# Patient Record
Sex: Male | Born: 1950 | Race: Black or African American | Hispanic: No | Marital: Single
Health system: Southern US, Community
[De-identification: ages and names within clinical notes are randomized; demographics above are authoritative.]

## PROBLEM LIST (undated history)

## (undated) DIAGNOSIS — I10 Essential (primary) hypertension: Secondary | ICD-10-CM

## (undated) DIAGNOSIS — Z993 Dependence on wheelchair: Secondary | ICD-10-CM

## (undated) DIAGNOSIS — E7800 Pure hypercholesterolemia, unspecified: Secondary | ICD-10-CM

## (undated) DIAGNOSIS — L409 Psoriasis, unspecified: Secondary | ICD-10-CM

## (undated) DIAGNOSIS — J302 Other seasonal allergic rhinitis: Secondary | ICD-10-CM

## (undated) DIAGNOSIS — G5793 Unspecified mononeuropathy of bilateral lower limbs: Secondary | ICD-10-CM

## (undated) DIAGNOSIS — E785 Hyperlipidemia, unspecified: Secondary | ICD-10-CM

## (undated) DIAGNOSIS — E119 Type 2 diabetes mellitus without complications: Secondary | ICD-10-CM

## (undated) DIAGNOSIS — Z9181 History of falling: Secondary | ICD-10-CM

## (undated) DIAGNOSIS — I639 Cerebral infarction, unspecified: Secondary | ICD-10-CM

## (undated) DIAGNOSIS — I2510 Atherosclerotic heart disease of native coronary artery without angina pectoris: Secondary | ICD-10-CM

## (undated) DIAGNOSIS — J309 Allergic rhinitis, unspecified: Secondary | ICD-10-CM

## (undated) HISTORY — DX: Hyperlipidemia, unspecified: E78.5

## (undated) HISTORY — DX: Essential (primary) hypertension: I10

## (undated) HISTORY — DX: Other seasonal allergic rhinitis: J30.2

## (undated) HISTORY — DX: Atherosclerotic heart disease of native coronary artery without angina pectoris: I25.10

## (undated) HISTORY — DX: Unspecified mononeuropathy of bilateral lower limbs: G57.93

## (undated) HISTORY — DX: History of falling: Z91.81

## (undated) HISTORY — DX: Type 2 diabetes mellitus without complications: E11.9

## (undated) HISTORY — PX: CORONARY STENT PLACEMENT: SHX1402

## (undated) HISTORY — DX: Psoriasis, unspecified: L40.9

## (undated) HISTORY — DX: Allergic rhinitis, unspecified: J30.9

---

## 2003-11-15 ENCOUNTER — Other Ambulatory Visit: Payer: Self-pay

## 2005-11-18 ENCOUNTER — Emergency Department: Payer: Self-pay | Admitting: Unknown Physician Specialty

## 2007-10-22 ENCOUNTER — Emergency Department: Payer: Self-pay | Admitting: Internal Medicine

## 2008-03-30 ENCOUNTER — Ambulatory Visit: Payer: Self-pay | Admitting: Family Medicine

## 2008-04-07 ENCOUNTER — Ambulatory Visit: Payer: Self-pay | Admitting: Family Medicine

## 2008-05-08 ENCOUNTER — Ambulatory Visit: Payer: Self-pay | Admitting: Family Medicine

## 2008-05-09 ENCOUNTER — Emergency Department: Payer: Self-pay | Admitting: Emergency Medicine

## 2008-06-07 ENCOUNTER — Ambulatory Visit: Payer: Self-pay | Admitting: Family Medicine

## 2008-07-05 ENCOUNTER — Ambulatory Visit: Payer: Self-pay | Admitting: Family Medicine

## 2008-07-08 ENCOUNTER — Ambulatory Visit: Payer: Self-pay

## 2008-08-03 ENCOUNTER — Ambulatory Visit: Payer: Self-pay | Admitting: Family Medicine

## 2009-01-24 ENCOUNTER — Ambulatory Visit: Payer: Self-pay | Admitting: Family Medicine

## 2010-07-14 ENCOUNTER — Ambulatory Visit: Payer: Self-pay | Admitting: Gastroenterology

## 2010-07-16 LAB — PATHOLOGY REPORT

## 2010-08-25 ENCOUNTER — Ambulatory Visit: Payer: Self-pay | Admitting: Family Medicine

## 2010-11-19 ENCOUNTER — Emergency Department: Payer: Self-pay | Admitting: Emergency Medicine

## 2011-01-01 ENCOUNTER — Ambulatory Visit: Payer: Self-pay | Admitting: Family Medicine

## 2011-05-07 ENCOUNTER — Ambulatory Visit: Payer: Self-pay | Admitting: Cardiology

## 2011-11-10 ENCOUNTER — Ambulatory Visit: Payer: Self-pay | Admitting: Family Medicine

## 2012-01-09 ENCOUNTER — Inpatient Hospital Stay: Payer: Self-pay | Admitting: Internal Medicine

## 2012-01-09 LAB — PROTIME-INR
INR: 1.2
Prothrombin Time: 15.4 secs — ABNORMAL HIGH (ref 11.5–14.7)

## 2012-01-09 LAB — COMPREHENSIVE METABOLIC PANEL
Albumin: 3.1 g/dL — ABNORMAL LOW (ref 3.4–5.0)
Alkaline Phosphatase: 77 U/L (ref 50–136)
Anion Gap: 8 (ref 7–16)
Creatinine: 1.56 mg/dL — ABNORMAL HIGH (ref 0.60–1.30)
EGFR (African American): 54 — ABNORMAL LOW
Glucose: 181 mg/dL — ABNORMAL HIGH (ref 65–99)
Osmolality: 278 (ref 275–301)
Sodium: 136 mmol/L (ref 136–145)
Total Protein: 7.6 g/dL (ref 6.4–8.2)

## 2012-01-09 LAB — CK TOTAL AND CKMB (NOT AT ARMC)
CK, Total: 636 U/L — ABNORMAL HIGH (ref 35–232)
CK-MB: 2.3 ng/mL (ref 0.5–3.6)

## 2012-01-09 LAB — CBC
MCV: 94 fL (ref 80–100)
RBC: 4.35 10*6/uL — ABNORMAL LOW (ref 4.40–5.90)
RDW: 13.5 % (ref 11.5–14.5)

## 2012-01-09 LAB — PRO B NATRIURETIC PEPTIDE: B-Type Natriuretic Peptide: 199 pg/mL — ABNORMAL HIGH (ref 0–125)

## 2012-01-10 LAB — BASIC METABOLIC PANEL
Anion Gap: 11 (ref 7–16)
Chloride: 103 mmol/L (ref 98–107)
Co2: 23 mmol/L (ref 21–32)
Creatinine: 1.77 mg/dL — ABNORMAL HIGH (ref 0.60–1.30)
EGFR (African American): 47 — ABNORMAL LOW
EGFR (Non-African Amer.): 40 — ABNORMAL LOW
Osmolality: 286 (ref 275–301)
Potassium: 4.2 mmol/L (ref 3.5–5.1)
Sodium: 137 mmol/L (ref 136–145)

## 2012-01-10 LAB — CBC WITH DIFFERENTIAL/PLATELET
Basophil #: 0 10*3/uL (ref 0.0–0.1)
Eosinophil #: 0 10*3/uL (ref 0.0–0.7)
Eosinophil %: 0 %
HGB: 13.8 g/dL (ref 13.0–18.0)
Lymphocyte %: 2.3 %
MCH: 30.8 pg (ref 26.0–34.0)
MCHC: 32.9 g/dL (ref 32.0–36.0)
MCV: 94 fL (ref 80–100)
Monocyte #: 0.4 x10 3/mm (ref 0.2–1.0)
Monocyte %: 1.9 %
Platelet: 241 10*3/uL (ref 150–440)
RBC: 4.48 10*6/uL (ref 4.40–5.90)
RDW: 13.8 % (ref 11.5–14.5)
WBC: 18.7 10*3/uL — ABNORMAL HIGH (ref 3.8–10.6)

## 2012-01-10 LAB — CK TOTAL AND CKMB (NOT AT ARMC)
CK, Total: 562 U/L — ABNORMAL HIGH (ref 35–232)
CK, Total: 443 U/L — ABNORMAL HIGH (ref 35–232)
CK-MB: 2.7 ng/mL (ref 0.5–3.6)
CK-MB: 2.8 ng/mL (ref 0.5–3.6)

## 2012-01-11 LAB — CBC WITH DIFFERENTIAL/PLATELET
Basophil #: 0.1 10*3/uL (ref 0.0–0.1)
Eosinophil #: 0 10*3/uL (ref 0.0–0.7)
Eosinophil %: 0.1 %
HCT: 39.2 % — ABNORMAL LOW (ref 40.0–52.0)
HGB: 13.1 g/dL (ref 13.0–18.0)
MCH: 31.1 pg (ref 26.0–34.0)
MCHC: 33.3 g/dL (ref 32.0–36.0)
Monocyte #: 1 x10 3/mm (ref 0.2–1.0)
Monocyte %: 4.7 %
Neutrophil #: 19.6 10*3/uL — ABNORMAL HIGH (ref 1.4–6.5)
Neutrophil %: 90.3 %
RDW: 14 % (ref 11.5–14.5)
WBC: 21.7 10*3/uL — ABNORMAL HIGH (ref 3.8–10.6)

## 2012-01-11 LAB — BASIC METABOLIC PANEL
Anion Gap: 8 (ref 7–16)
Calcium, Total: 8.4 mg/dL — ABNORMAL LOW (ref 8.5–10.1)
Chloride: 112 mmol/L — ABNORMAL HIGH (ref 98–107)
Co2: 22 mmol/L (ref 21–32)
Creatinine: 1.23 mg/dL (ref 0.60–1.30)

## 2012-05-30 ENCOUNTER — Ambulatory Visit: Payer: Self-pay | Admitting: Family Medicine

## 2012-07-04 ENCOUNTER — Ambulatory Visit: Payer: Self-pay | Admitting: Family Medicine

## 2012-07-11 ENCOUNTER — Emergency Department: Payer: Self-pay | Admitting: Emergency Medicine

## 2012-07-11 LAB — BASIC METABOLIC PANEL
Anion Gap: 8 (ref 7–16)
BUN: 14 mg/dL (ref 7–18)
Calcium, Total: 9.3 mg/dL (ref 8.5–10.1)
Chloride: 107 mmol/L (ref 98–107)
Glucose: 191 mg/dL — ABNORMAL HIGH (ref 65–99)
Osmolality: 285 (ref 275–301)

## 2012-07-11 LAB — CBC WITH DIFFERENTIAL/PLATELET
Basophil #: 0 10*3/uL (ref 0.0–0.1)
Eosinophil #: 0.1 10*3/uL (ref 0.0–0.7)
Lymphocyte #: 1.4 10*3/uL (ref 1.0–3.6)
Lymphocyte %: 18.2 %
MCV: 93 fL (ref 80–100)
Monocyte %: 7.2 %
Neutrophil #: 5.4 10*3/uL (ref 1.4–6.5)
Platelet: 295 10*3/uL (ref 150–440)
RDW: 13.6 % (ref 11.5–14.5)
WBC: 7.8 10*3/uL (ref 3.8–10.6)

## 2012-10-02 ENCOUNTER — Emergency Department: Payer: Self-pay | Admitting: Emergency Medicine

## 2013-03-10 IMAGING — CR RIGHT ANKLE - COMPLETE 3+ VIEW
1 series · 5 of 5 positions shown · non-contrast
Comparison: none

REASON FOR EXAM: pain
COMMENTS:

PROCEDURE:     NIEVE - NIEVE ANKLE RIGHT COMPLETE  - July 04, 2012  [DATE]
RESULT:     There is no evidence of fracture, dislocation, or malalignment.
Soft tissues are identified within the medial and lateral malleolar regions
possibly representing ligamentous injury.

[Series 1: ap · 0.17mm/px · 5 of 5 slices shown]
[im 1/5]
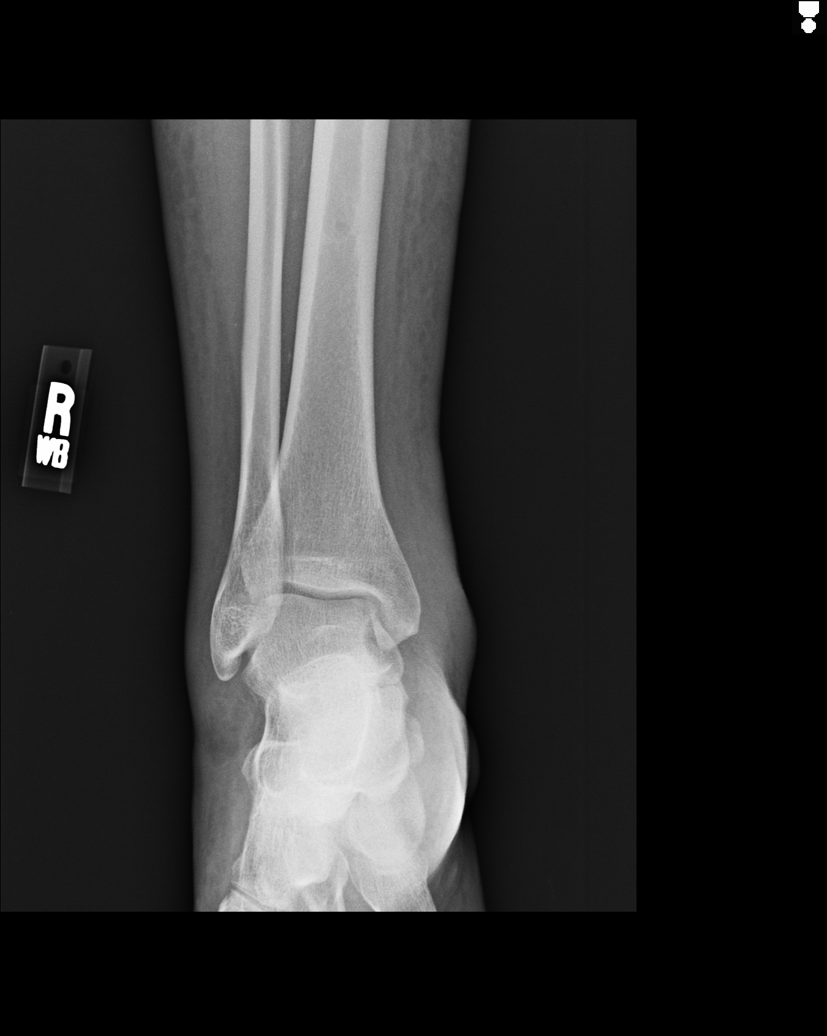
[im 2/5]
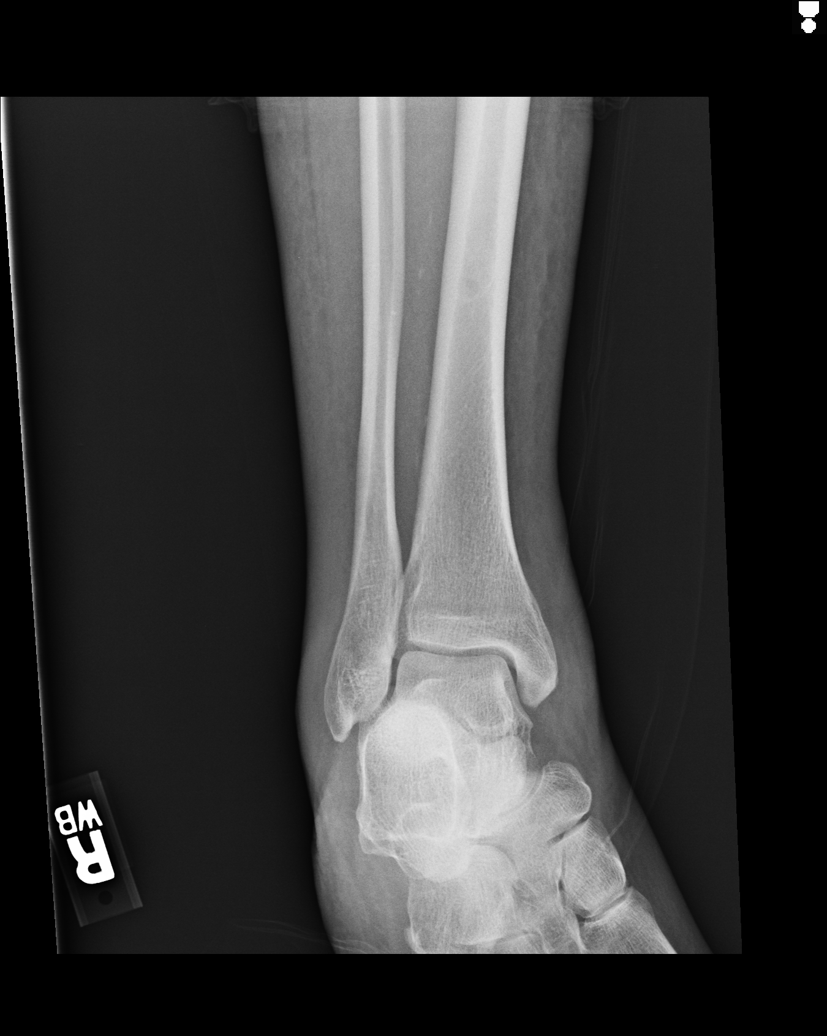
[im 3/5]
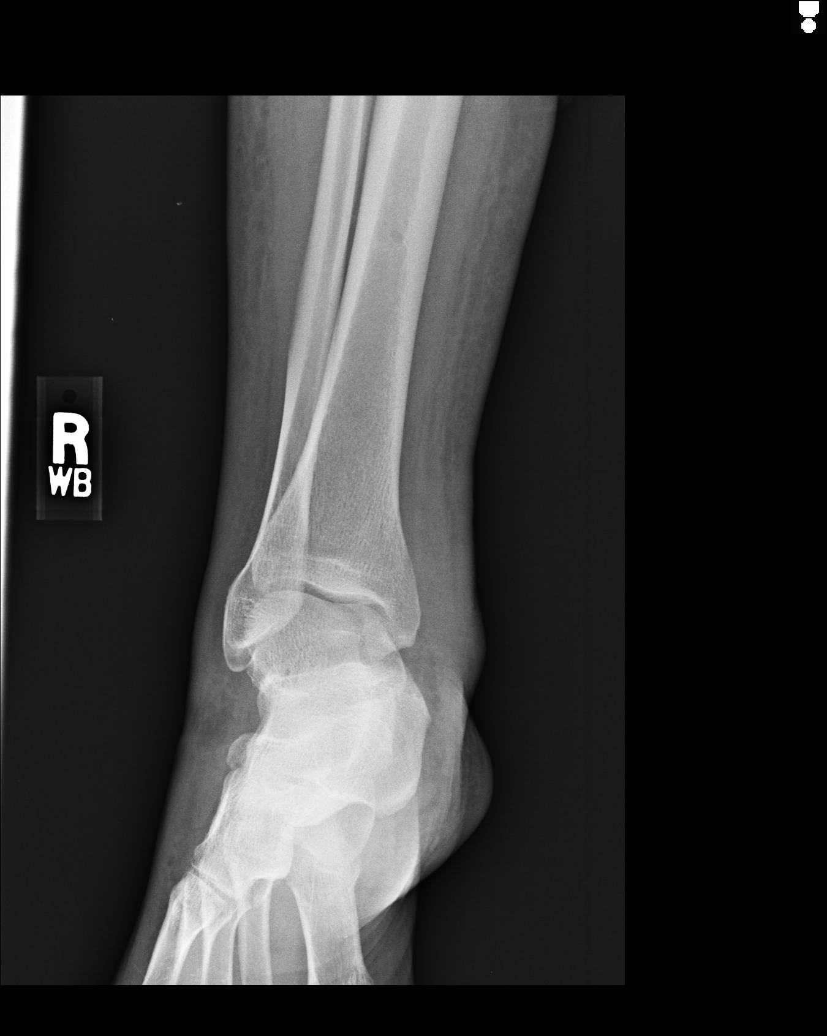
[im 4/5]
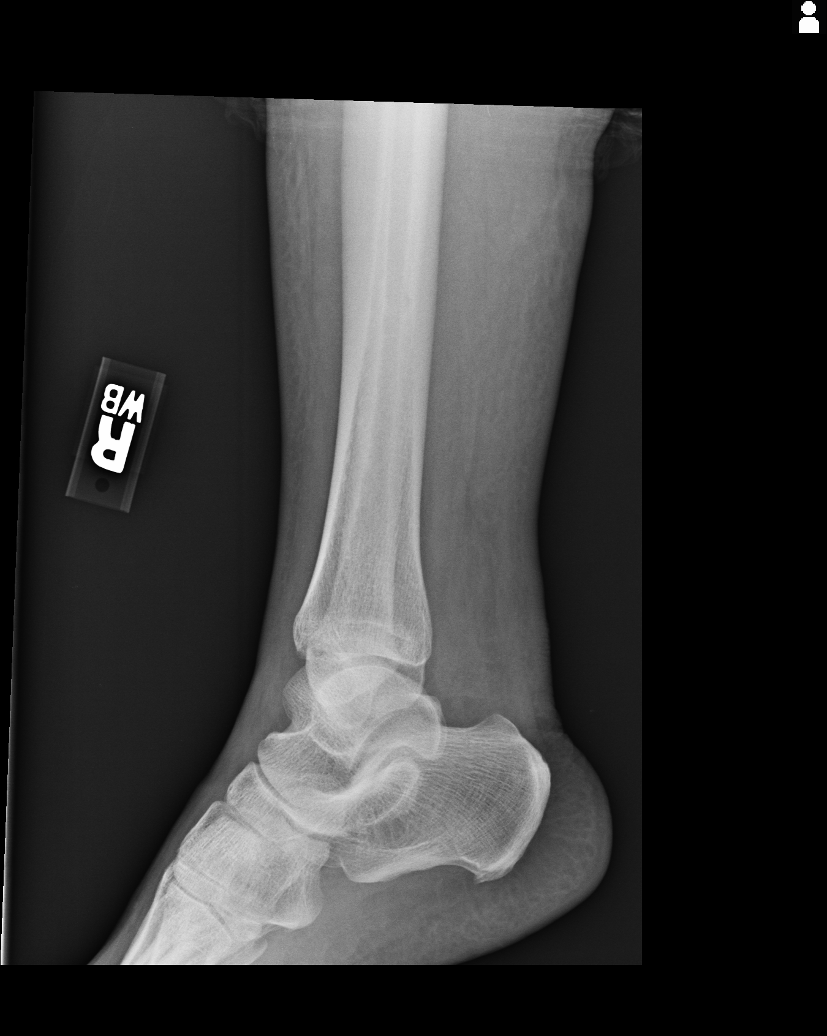
[im 5/5]
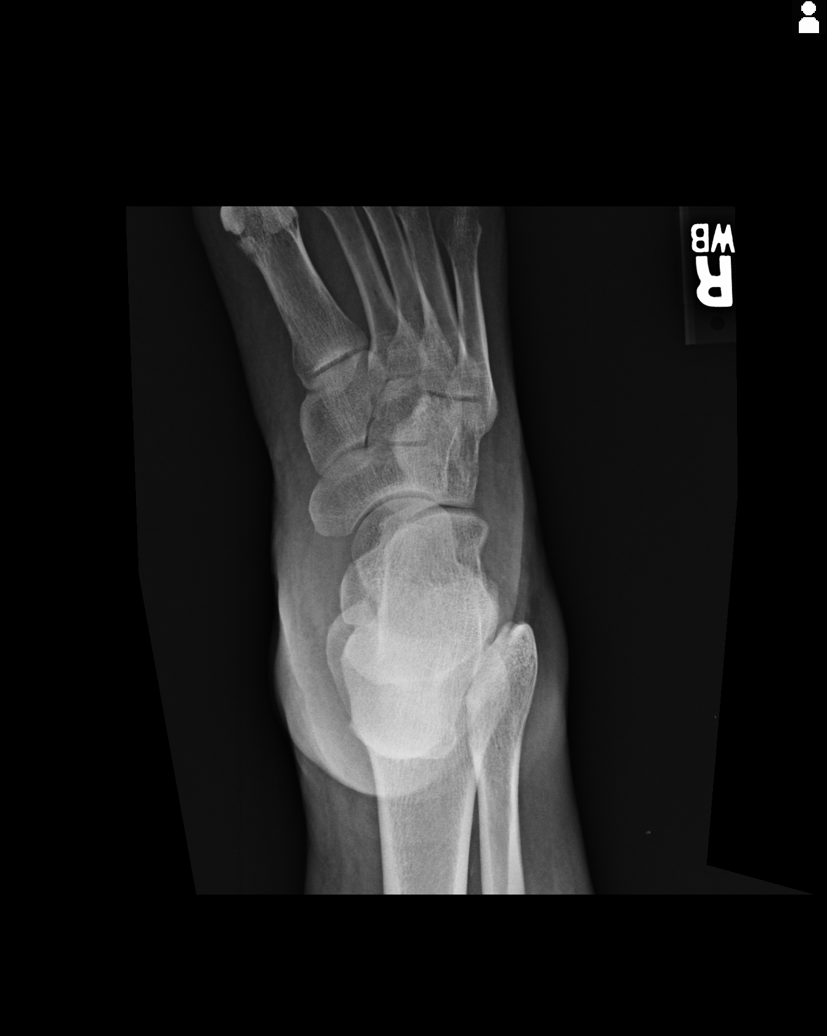

[5 of 5 positions shown; findings below may reference images not displayed]

IMPRESSION: 1. No evidence of acute abnormalities.
2. If there are persistent complaints of pain or persistent clinical
concern, a repeat evaluation in 7-10 days is recommended if clinically
warranted.

## 2013-03-20 ENCOUNTER — Ambulatory Visit: Payer: Self-pay | Admitting: Family Medicine

## 2013-07-10 ENCOUNTER — Emergency Department: Payer: Self-pay | Admitting: Emergency Medicine

## 2013-07-10 LAB — CBC
HCT: 40.9 % (ref 40.0–52.0)
HGB: 13.6 g/dL (ref 13.0–18.0)
MCH: 30.1 pg (ref 26.0–34.0)
MCHC: 33.2 g/dL (ref 32.0–36.0)
MCV: 91 fL (ref 80–100)
Platelet: 282 10*3/uL (ref 150–440)
RBC: 4.51 10*6/uL (ref 4.40–5.90)
RDW: 14.1 % (ref 11.5–14.5)
WBC: 8.8 10*3/uL (ref 3.8–10.6)

## 2013-07-10 LAB — COMPREHENSIVE METABOLIC PANEL
Albumin: 2.9 g/dL — ABNORMAL LOW (ref 3.4–5.0)
Alkaline Phosphatase: 76 U/L (ref 50–136)
Anion Gap: 0 — ABNORMAL LOW (ref 7–16)
BUN: 11 mg/dL (ref 7–18)
Calcium, Total: 8.9 mg/dL (ref 8.5–10.1)
Chloride: 105 mmol/L (ref 98–107)
Creatinine: 0.96 mg/dL (ref 0.60–1.30)
EGFR (African American): >60
EGFR (Non-African Amer.): >60
Osmolality: 267 (ref 275–301)
Potassium: 5.5 mmol/L — ABNORMAL HIGH (ref 3.5–5.1)
SGOT(AST): 38 U/L — ABNORMAL HIGH (ref 15–37)
SGPT (ALT): 27 U/L (ref 12–78)
Sodium: 131 mmol/L — ABNORMAL LOW (ref 136–145)
Total Protein: 7.3 g/dL (ref 6.4–8.2)

## 2013-07-10 LAB — URINALYSIS, COMPLETE
Bilirubin,UR: NEGATIVE
Blood: NEGATIVE
Glucose,UR: 150 mg/dL (ref 0–75)
Ketone: NEGATIVE
Nitrite: NEGATIVE
Protein: NEGATIVE
RBC,UR: NONE SEEN /HPF (ref 0–5)
Squamous Epithelial: 5
WBC UR: 2 /HPF (ref 0–5)

## 2013-07-12 DIAGNOSIS — L409 Psoriasis, unspecified: Secondary | ICD-10-CM | POA: Insufficient documentation

## 2013-07-12 DIAGNOSIS — E1140 Type 2 diabetes mellitus with diabetic neuropathy, unspecified: Secondary | ICD-10-CM | POA: Insufficient documentation

## 2013-07-12 DIAGNOSIS — R079 Chest pain, unspecified: Secondary | ICD-10-CM | POA: Insufficient documentation

## 2013-07-12 DIAGNOSIS — I10 Essential (primary) hypertension: Secondary | ICD-10-CM | POA: Insufficient documentation

## 2013-07-12 DIAGNOSIS — I2510 Atherosclerotic heart disease of native coronary artery without angina pectoris: Secondary | ICD-10-CM | POA: Insufficient documentation

## 2013-10-17 ENCOUNTER — Ambulatory Visit: Payer: Self-pay | Admitting: Family Medicine

## 2014-03-14 DIAGNOSIS — Z9889 Other specified postprocedural states: Secondary | ICD-10-CM | POA: Insufficient documentation

## 2014-03-14 DIAGNOSIS — E785 Hyperlipidemia, unspecified: Secondary | ICD-10-CM | POA: Insufficient documentation

## 2014-08-28 ENCOUNTER — Emergency Department: Payer: Self-pay | Admitting: Emergency Medicine

## 2014-11-13 DIAGNOSIS — T8189XA Other complications of procedures, not elsewhere classified, initial encounter: Secondary | ICD-10-CM | POA: Diagnosis not present

## 2014-11-13 DIAGNOSIS — G5791 Unspecified mononeuropathy of right lower limb: Secondary | ICD-10-CM | POA: Diagnosis not present

## 2014-11-13 DIAGNOSIS — E119 Type 2 diabetes mellitus without complications: Secondary | ICD-10-CM | POA: Diagnosis not present

## 2014-11-13 DIAGNOSIS — I2510 Atherosclerotic heart disease of native coronary artery without angina pectoris: Secondary | ICD-10-CM | POA: Diagnosis not present

## 2014-11-13 DIAGNOSIS — I10 Essential (primary) hypertension: Secondary | ICD-10-CM | POA: Diagnosis not present

## 2014-11-13 DIAGNOSIS — G5792 Unspecified mononeuropathy of left lower limb: Secondary | ICD-10-CM | POA: Diagnosis not present

## 2014-11-13 DIAGNOSIS — E785 Hyperlipidemia, unspecified: Secondary | ICD-10-CM | POA: Diagnosis not present

## 2014-11-13 DIAGNOSIS — J302 Other seasonal allergic rhinitis: Secondary | ICD-10-CM | POA: Diagnosis not present

## 2014-12-03 DIAGNOSIS — I10 Essential (primary) hypertension: Secondary | ICD-10-CM | POA: Diagnosis not present

## 2014-12-03 DIAGNOSIS — E785 Hyperlipidemia, unspecified: Secondary | ICD-10-CM | POA: Diagnosis not present

## 2014-12-18 DIAGNOSIS — R0789 Other chest pain: Secondary | ICD-10-CM | POA: Diagnosis not present

## 2014-12-18 DIAGNOSIS — I25119 Atherosclerotic heart disease of native coronary artery with unspecified angina pectoris: Secondary | ICD-10-CM | POA: Diagnosis not present

## 2014-12-18 DIAGNOSIS — E119 Type 2 diabetes mellitus without complications: Secondary | ICD-10-CM | POA: Diagnosis not present

## 2014-12-18 DIAGNOSIS — I10 Essential (primary) hypertension: Secondary | ICD-10-CM | POA: Diagnosis not present

## 2014-12-30 NOTE — Discharge Summary (Signed)
PATIENT NAME:  Juan King, [name] MR#:  [id number] DATE OF BIRTH:  27-Jan-1951  DATE OF ADMISSION:  01/09/2012 DATE OF DISCHARGE:  01/11/2012  PRIMARY CARE PHYSICIAN: Dr. Nilda SimmerKristi Smith   FINAL DIAGNOSES:  1. Clinical sepsis.  2. Pneumonia.  3. Acute renal failure, which improved.  4. Hypertension.  5. Diabetes.  6. Hyperlipidemia.  7. History of coronary artery disease.   MEDICATIONS ON DISCHARGE:  1. Metformin extended-release 500 mg daily.  2. Dyazide 25/37.5 mg 1 tablet daily.  3. Simvastatin 80 mg daily.  4. Aspirin 81 mg daily.  5. Plavix 75 mg daily.  6. Amlodipine/benazepril 10/40, 1 tablet daily.  7. Levaquin 500 mg p.o. daily until completion.  8. Albuterol inhaler 2 puffs q.6 hours as needed for shortness of breath.   DIET: Low sodium diet 1800 ADA diet.   ACTIVITY: Activity as tolerated.   FOLLOW UP: Follow up in one week with Dr. Nilda SimmerKristi Smith.   REASON FOR ADMISSION: Patient was admitted 01/09/2012, discharged 01/11/2012. Patient came in with short winded, vomiting after lunch.   HISTORY OF PRESENT ILLNESS: 64 year old man with history of coronary artery disease, diabetes, hypertension, hyperlipidemia presents with shortness of breath, cough, brownish sputum, also vomited one time. In the Emergency Room he had a temperature of 100.2, heart rate was up 120 to 145, sinus tachycardia. In the Emergency Room patient received IV Solu-Medrol, Levaquin. Hospitalist services were contacted for admission. White count was elevated and chest x-ray consistent with a pneumonia. Patient was admitted with sepsis, started on IV Levaquin for the pneumonia, given IV fluid hydration. For the acute renal failure his Glucophage was held. Dyazide was held.   LABORATORY, DIAGNOSTIC AND RADIOLOGICAL DATA: BNP 199, glucose 181, BUN 18, creatinine 1.56, sodium 136, potassium 3.9, chloride 106, CO2 22, calcium 8.9, bilirubin 1.2. Liver function tests normal range. Albumin 3.1. White blood cell count  20.3, hemoglobin and hematocrit 13.5 and 40.8, platelet count 258. PT 15.4, INR 1.2, PTT 28.7. Chest x-ray showed findings consistent with atelectasis or developing infiltrate in the left lower lung. Blood cultures negative for 36 hours. EKG showed sinus tachycardia 122 beats per minute. CT scan of the chest showed pleural based density adjacent to the major fissure, anterior aspect of the left lower lobe likely reflects an ill-defined pneumonia but a neoplasm cannot be dogmatically excluded. Follow-up chest x-rays and CT scan recommended to ensure clearing. Cardiac enzymes remain negative. Creatinine did increase to 1.77 and then down to 1.23 upon discharge. White count upon discharge was still elevated at 21.7.   HOSPITAL COURSE PER PROBLEM LIST:  1. For the patient's clinical sepsis, his vital signs had improved, heart rate under 100, patient afebrile. He did still have persistent leukocytosis. He was feeling better with regards to his pneumonia and wanted to be discharged home. I discharged him home in stable condition with close clinical follow up on Levaquin 500 mg daily.  2. For the patient's pneumonia, the patient did improve during the hospital course. Vital signs had improved. Patient afebrile. The patient does still have an elevated white count. I recommend checking a CBC in one week with follow-up appointment.  3. For the patient's acute renal failure, he improved with IV fluid hydration, creatinine down to 1.23 upon discharge, GFR greater than 60, so I can restart his Dyazide and metformin as outpatient. Recommend checking a BMP next week.  4. For the patient's hypertension, blood pressure currently stable, 135/84.  5. For the patient's diabetes, he was held on  sliding scale during the hospital stay and his Glucophage was held. Glucophage can be restarted as outpatient.  6. For his hyperlipidemia, he is on simvastatin.  7. For his history of coronary artery disease, he is on aspirin and Plavix.   8. Recommend checking a repeat chest x-ray or CT scan of the chest in about six weeks to ensure clearing of the pneumonia.    TIME SPENT ON DISCHARGE: 35 minutes.   ____________________________ Herschell Dimes. Renae Gloss, MD rjw:cms D: 01/11/2012 16:56:07 ET T: 01/12/2012 11:19:25 ET JOB#: [id number]  cc: Herschell Dimes. Renae Gloss, MD, <Dictator> Myrle Sheng. Katrinka Blazing, MD Salley Scarlet MD ELECTRONICALLY SIGNED 01/18/2012 12:49

## 2014-12-30 NOTE — H&P (Signed)
PATIENT NAME:  Juan King, Juan King MR#:  [id number] DATE OF BIRTH:  25-Jun-1951  DATE OF ADMISSION:  01/09/2012  PRIMARY CARE PHYSICIAN: Dr. Nilda Simmer  CARDIOLOGIST: Dr. Marcina Millard   CHIEF COMPLAINT: I was short winded and also vomited after I ate lunch.  HISTORY OF PRESENT ILLNESS: The patient is a very poor historian. He is a 64 year old male  who has history of coronary artery disease-he had a drug-eluting stent placed in the LAD in August of 2012, diabetes, hypertension, hyperlipidemia. He denies any history of asthma or chronic obstructive pulmonary disease. Today he presented to the Emergency Room because he said he was short winded. He is complaining of cough with brownish sputum. He denies any chest pain. He said he also threw up today after he ate food. He denies any abdominal pain, denies any diarrhea. He said he was running a fever at home also. His temperature in the Emergency Room, as per the ER physician, was at one time 100.2. He was found to be extremely tachycardic in the Emergency Room. His heart rate shot up to 120 to 145, sinus tachycardia in the Emergency Room. The patient got DuoNebs in the Emergency Room along with IV Solu-Medrol, Levaquin; and Hospitalist Service was asked to admit the patient because of pneumonia along with tachycardia. He denies any pleuritic chest pain. His initial work-up in the Emergency Room showed that he had a white count of 20,000. His creatinine is elevated at 1.56. BNP is almost negative at 199. His chest x-ray was suggestive of a developing infiltrate in the left lower lobe, and a CT of the chest is pending at this time.   REVIEW OF SYSTEMS: He is a limited historian. CONSTITUTIONAL: Positive for fever. HEENT: He denies any change in his vision. Denies any headache or dizziness. RESPIRATORY:  He is complaining of cough, shortness of breath, brownish expectoration. CARDIOVASCULAR: No chest pain. No palpitations. No syncope. GASTROINTESTINAL: He  is also complaining of some nausea and vomiting. No abdominal pain. No diarrhea. GENITOURINARY: No dysuria. No frequency. ENDOCRINE: No thyroid problems. HEMATOLOGIC: No anemia. INTEGUMENT: No rash. MUSCULOSKELETAL: No joint pains or swelling. NEUROLOGIC: No focal numbness or weakness. PSYCHIATRIC: Denies any anxiety.    PAST MEDICAL HISTORY:  1. Coronary artery disease. He had a cardiac catheterization done in August 2012, and he had a drug-eluting stent placed in the left anterior descending artery in August 2012.  2. Diabetes. 3. Hypertension. 4. Hyperlipidemia.   PAST SURGICAL HISTORY: None.   ALLERGIES TO MEDICATIONS: None.   HOME MEDICATIONS: As per Dr. Darrold Junker' list at [facility]:  1. Aspirin 81 mg daily.  2. Plavix 75 mg daily. 3. Triamterene/hydrochlorothiazide 1 capsule daily. 4. Metformin XR 5 mg daily.  5. Lipitor 40 mg daily.  6. Amlodipine/benazepril 10/40 mg daily.   SOCIAL HISTORY: He is married. He lives with his wife. He has three kids. He denies any smoking, alcohol, or drug use.   FAMILY HISTORY: He says his mother and sister had stroke. As per [facility] note, the mother had myocardial infarction in her 56s.   PHYSICAL EXAMINATION:  VITAL SIGNS: Temperature 96.2 when she came in, heart rate of 145, respiratory rate 22, blood pressure 138/76, saturating 94% on room air.   GENERAL: This is an elderly African American male. He is comfortably sitting on room air, in no acute distress, but he is diaphoretic.   HEENT: Bilateral pupils are equal. Extraocular muscles are intact. No scleral icterus. No conjunctivitis. Oral mucosa is  moist. No pallor.   NECK: No thyroid tenderness, enlargement or nodule. Neck is supple. No masses, nontender. No adenopathy. No JVD. No carotid bruit.   CHEST: He has some crackles at the bases, more on the left side. Normal respiratory effort. Not using accessory muscles of respiration.   HEART: Heart sounds are regular,  tachycardic. No murmur. Good peripheral pulses. No lower extremity edema.   ABDOMEN: Abdomen appears to be soft and nontender. Good bowel sounds. No hepatomegaly. No bruit. No masses.   RECTAL: Exam is deferred.   NEUROLOGIC: He is awake, alert. He is oriented to time, place, and person. He has poor insight. Cranial nerves are intact. Moving all extremities against gravity.   SKIN: He has psoriatic patches on his flexural surfaces of the left upper extremity.   LABORATORY, DIAGNOSTIC AND RADIOLOGICAL DATA:  White count of 20.3, hemoglobin 13.5, platelet count 258,000.  BMP shows sodium 136, potassium 3.9, BUN 18, creatinine 1.56. His last creatinine in August of 2012 was normal at 1.1.  His CK is elevated at 636. Troponin is negative.  His BNP, as I said before, is 199, almost negative.  His chest x-ray is suggestive of atelectasis or developing infiltrate in the left lower lobe.  His EKG shows that he has sinus tachycardia at 122. No acute ischemic changes.   IMPRESSION:  1. Evidence of systemic inflammatory response syndrome secondary to pneumonia as evidenced by leukocytosis and tachycardia. 2. Left lower lobe pneumonia.  3. Leukocytosis most likely secondary to pneumonia.  4. Acute renal failure, may be secondary to dehydration. 5. Sinus tachycardia, may be secondary to dehydration and pneumonia. 6. Coronary artery disease with previous drug-eluting stent in August 2012.  7. Diabetes. 8. Hypertension.  9. Hyperlipidemia.  10. Psoriasis.   PLAN: A 64 year old male who has history of coronary artery disease, recent drug-eluting stent, diabetes, hypertension, hyperlipidemia, presents with vomiting. He presented with shortness of breath with brownish expectoration, found to have leukocytosis with white count of 20,000. His creatinine is elevated at 1.5. He is tachycardic, and he has a developing infiltrate in the left lower lobe. Blood cultures have been done in the Emergency Room as  well as sputum culture. He got Levaquin in the Emergency Room. We will continue Levaquin on him. He is not wheezing, and he does not have any history of asthma or chronic obstructive pulmonary disease.  I will not continue Solu-Medrol on him at this time. I will give him IV hydration. I am going to hold his Maxzide at this time because of his acute renal failure. I am also going to cut down his benazepril at this time because of renal failure. I will hold his metformin because of his renal failure. With IV hydration, his heart rate should improve. We will continue aspirin, Plavix, statin, and we will also add some beta blocker. Considering that he had a recent stent and coronary artery disease his heart rate needs to be better controlled. He has a CT of the chest pending at this time. This needs to be followed up, but it appears to be low probability for PE. We will monitor him on telemetry.    TIME SPENT:  Time spent with admission and coordination of care was 50 minutes.   ____________________________ Fredia SorrowAbhinav Madalyn Legner, MD ag:cbb D: 01/09/2012 21:08:23 ET T: 01/10/2012 08:48:58 ET JOB#: [id number]  cc: Fredia SorrowAbhinav Falesha Schommer, MD, <Dictator> Myrle ShengKristi M. Katrinka BlazingSmith, MD Fredia SorrowABHINAV Errin Whitelaw MD ELECTRONICALLY SIGNED 02/03/2012 12:37

## 2015-02-22 ENCOUNTER — Telehealth: Payer: Self-pay | Admitting: Family Medicine

## 2015-02-22 NOTE — Telephone Encounter (Signed)
Spoke to pt's daughter who states her father needs refills on the following medications:   Amlodipine 2.5mg   Pharmacy: [facility]  Lisinotril 40 mg Pharmacy [facility]

## 2015-02-26 NOTE — Telephone Encounter (Signed)
Yes can refill for 1 month each with 3 refills.  He should have appt in August.  Need to send to one pharmacy or the other.  Difficult to send to different pharmacies.-jh

## 2015-02-26 NOTE — Telephone Encounter (Signed)
Can we do this or need appt?

## 2015-02-28 MED ORDER — LISINOPRIL 40 MG PO TABS: 40.0000 mg | ORAL_TABLET | Freq: Every day | ORAL | Status: DC

## 2015-02-28 MED ORDER — AMLODIPINE BESYLATE 2.5 MG PO TABS: 2.5000 mg | ORAL_TABLET | Freq: Every day | ORAL | Status: DC

## 2015-02-28 NOTE — Telephone Encounter (Signed)
Sent both prescriptions/JH

## 2015-04-17 ENCOUNTER — Ambulatory Visit (INDEPENDENT_AMBULATORY_CARE_PROVIDER_SITE_OTHER): Payer: Commercial Managed Care - HMO | Admitting: Family Medicine

## 2015-04-17 ENCOUNTER — Encounter: Payer: Self-pay | Admitting: Family Medicine

## 2015-04-17 VITALS — BP 145/80 | HR 80 | Temp 98.0°F | Resp 16 | Ht 60.0 in | Wt 138.0 lb

## 2015-04-17 DIAGNOSIS — I2510 Atherosclerotic heart disease of native coronary artery without angina pectoris: Secondary | ICD-10-CM | POA: Diagnosis not present

## 2015-04-17 DIAGNOSIS — J4520 Mild intermittent asthma, uncomplicated: Secondary | ICD-10-CM | POA: Diagnosis not present

## 2015-04-17 DIAGNOSIS — E785 Hyperlipidemia, unspecified: Secondary | ICD-10-CM

## 2015-04-17 DIAGNOSIS — I10 Essential (primary) hypertension: Secondary | ICD-10-CM | POA: Diagnosis not present

## 2015-04-17 DIAGNOSIS — W3400XA Accidental discharge from unspecified firearms or gun, initial encounter: Secondary | ICD-10-CM | POA: Insufficient documentation

## 2015-04-17 DIAGNOSIS — J302 Other seasonal allergic rhinitis: Secondary | ICD-10-CM | POA: Insufficient documentation

## 2015-04-17 DIAGNOSIS — E119 Type 2 diabetes mellitus without complications: Secondary | ICD-10-CM

## 2015-04-17 DIAGNOSIS — J45909 Unspecified asthma, uncomplicated: Secondary | ICD-10-CM | POA: Insufficient documentation

## 2015-04-17 LAB — POCT GLYCOSYLATED HEMOGLOBIN (HGB A1C): HEMOGLOBIN A1C: 6.4

## 2015-04-17 MED ORDER — LISINOPRIL 40 MG PO TABS: 40.0000 mg | ORAL_TABLET | Freq: Every day | ORAL | Status: DC

## 2015-04-17 MED ORDER — AMLODIPINE BESYLATE 5 MG PO TABS: 5.0000 mg | ORAL_TABLET | Freq: Every day | ORAL | Status: DC

## 2015-04-17 MED ORDER — AMITRIPTYLINE HCL 10 MG PO TABS: 10.0000 mg | ORAL_TABLET | Freq: Every day | ORAL | Status: DC

## 2015-04-17 MED ORDER — METFORMIN HCL 500 MG PO TABS: 500.0000 mg | ORAL_TABLET | Freq: Every day | ORAL | Status: DC

## 2015-04-17 MED ORDER — METOPROLOL SUCCINATE ER 200 MG PO TB24: 200.0000 mg | ORAL_TABLET | Freq: Every day | ORAL | Status: DC

## 2015-04-17 MED ORDER — ATORVASTATIN CALCIUM 80 MG PO TABS: 80.0000 mg | ORAL_TABLET | Freq: Every day | ORAL | Status: DC

## 2015-04-17 MED ORDER — FLUTICASONE PROPIONATE 50 MCG/ACT NA SUSP: 1.0000 | Freq: Every day | NASAL | Status: DC

## 2015-04-17 MED ORDER — ALBUTEROL SULFATE HFA 108 (90 BASE) MCG/ACT IN AERS: 2.0000 | INHALATION_SPRAY | Freq: Four times a day (QID) | RESPIRATORY_TRACT | Status: DC | PRN

## 2015-04-17 MED ORDER — ISOSORBIDE MONONITRATE ER 30 MG PO TB24: 30.0000 mg | ORAL_TABLET | Freq: Every day | ORAL | Status: DC

## 2015-04-17 NOTE — Patient Instructions (Signed)
ontinue all current meds except increase dose of Amlodipine.

## 2015-04-17 NOTE — Progress Notes (Signed)
Name: Juan King   MRN: [id number]    DOB: 11-15-1950   Date:04/17/2015       Progress Note  Subjective  Chief Complaint  Chief Complaint  Patient presents with  . Follow-up    F/U HTN/DM/LIPIDS  . Diabetes    with peripherql neuropathy  . Hyperlipidemia  . Hypertension  . Asthma  . Coronary Artery Disease    HPI  Here to f/u DM, HBP, elevaetd lipids, ASCVD, Asthma, seasonal allergies.  Taking all meds.  His peripheral neuropathy is doing well overall.  BSs at home 115-125.   No problem-specific assessment & plan notes found for this encounter.   Past Medical History  Diagnosis Date  . Diabetes mellitus type 2, controlled   . Screening for depression   . Neuropathy of both feet   . Personal history of fall   . Coronary arteriosclerosis   . Psoriasis   . Hypertension   . Primary hypertension   . Seasonal allergies   . Hyperlipidemia   . Allergic rhinitis     Social History  Substance Use Topics  . Smoking status: Never Smoker   . Smokeless tobacco: Not on file  . Alcohol Use: No     Current outpatient prescriptions:  .  albuterol (PROVENTIL HFA;VENTOLIN HFA) 108 (90 BASE) MCG/ACT inhaler, Inhale 2 puffs into the lungs every 6 (six) hours as needed for wheezing or shortness of breath., Disp: , Rfl:  .  amitriptyline (ELAVIL) 10 MG tablet, Take 10 mg by mouth at bedtime., Disp: , Rfl:  .  amLODipine (NORVASC) 2.5 MG tablet, Take 1 tablet (2.5 mg total) by mouth daily., Disp: 90 tablet, Rfl: 3 .  aspirin EC 81 MG tablet, Take 81 mg by mouth daily., Disp: , Rfl:  .  atorvastatin (LIPITOR) 40 MG tablet, Take 40 mg by mouth daily., Disp: , Rfl: 12 .  cholecalciferol (VITAMIN D) 1000 UNITS tablet, Take 1,000 Units by mouth daily., Disp: , Rfl:  .  fluticasone (FLONASE) 50 MCG/ACT nasal spray, Place 1 spray into both nostrils daily., Disp: , Rfl:  .  guaiFENesin (MUCINEX) 600 MG 12 hr tablet, Take 600 mg by mouth 2 (two) times daily., Disp: , Rfl:  .  isosorbide  mononitrate (IMDUR) 30 MG 24 hr tablet, Take 30 mg by mouth daily., Disp: , Rfl: 11 .  lisinopril (PRINIVIL,ZESTRIL) 40 MG tablet, Take 1 tablet (40 mg total) by mouth daily., Disp: 90 tablet, Rfl: 3 .  metFORMIN (GLUCOPHAGE) 500 MG tablet, Take 500 mg by mouth daily. , Disp: , Rfl:  .  metoprolol (TOPROL-XL) 200 MG 24 hr tablet, Take 200 mg by mouth daily., Disp: , Rfl:   No Known Allergies  Review of Systems  Constitutional: Negative for fever, chills, weight loss and malaise/fatigue.  HENT: Negative for hearing loss.   Eyes: Negative for blurred vision and double vision.  Respiratory: Negative for cough, sputum production, shortness of breath and wheezing.   Cardiovascular: Negative for chest pain, palpitations, orthopnea and leg swelling.  Gastrointestinal: Negative for heartburn, nausea, vomiting, abdominal pain, diarrhea and blood in stool.  Genitourinary: Negative for dysuria, urgency and frequency.  Skin: Negative for rash.  Neurological: Negative for dizziness, tingling, sensory change, focal weakness, weakness and headaches.  Psychiatric/Behavioral: Negative for depression. The patient is not nervous/anxious.       Objective  Filed Vitals:   04/17/15 0948  BP: 171/87  Pulse: 80  Temp: 98 F (36.7 C)  TempSrc: Oral  Resp: 16  Height: 5' (1.524 m)  Weight: 138 lb (62.596 kg)     Physical Exam  Constitutional: He is oriented to person, place, and time and well-developed, well-nourished, and in no distress. No distress.  HENT:  Head: Normocephalic and atraumatic.  Eyes: Conjunctivae and EOM are normal. Pupils are equal, round, and reactive to light. No scleral icterus.  Neck: Normal range of motion. Neck supple. Normal carotid pulses present. Carotid bruit is not present. No thyromegaly present.  Cardiovascular: Normal rate, regular rhythm, normal heart sounds and intact distal pulses.  Exam reveals no gallop and no friction rub.   No murmur heard. Pulmonary/Chest:  Effort normal and breath sounds normal. No respiratory distress. He has no wheezes. He has no rales.  Abdominal: Soft. Bowel sounds are normal. He exhibits no distension and no mass. There is no tenderness.  Musculoskeletal: Normal range of motion. He exhibits no edema.  Lymphadenopathy:    He has no cervical adenopathy.  Neurological: He is alert and oriented to person, place, and time. No cranial nerve deficit.  Vitals reviewed.     Recent Results (from the past 2160 hour(s))  POCT HgB A1C     Status: Abnormal   Collection Time: 04/17/15 10:10 AM  Result Value Ref Range   Hemoglobin A1C 6.4      Assessment & Plan  1. Type 2 diabetes mellitus without complication  - POCT HgB A1C - amitriptyline (ELAVIL) 10 MG tablet; Take 1 tablet (10 mg total) by mouth at bedtime.  Dispense: 90 tablet; Refill: 3 - metFORMIN (GLUCOPHAGE) 500 MG tablet; Take 1 tablet (500 mg total) by mouth daily with breakfast.  Dispense: 90 tablet; Refill: 3  2. Asthma, mild intermittent, uncomplicated  - albuterol (PROVENTIL HFA;VENTOLIN HFA) 108 (90 BASE) MCG/ACT inhaler; Inhale 2 puffs into the lungs every 6 (six) hours as needed for wheezing or shortness of breath.  Dispense: 3 Inhaler; Refill: 12  3. Seasonal allergies  - fluticasone (FLONASE) 50 MCG/ACT nasal spray; Place 1 spray into both nostrils daily.  Dispense: 16 g; Refill: 12  4. Essential hypertension  - amLODipine (NORVASC) 5 MG tablet; Take 1 tablet (5 mg total) by mouth daily.  Dispense: 90 tablet; Refill: 3 - lisinopril (PRINIVIL,ZESTRIL) 40 MG tablet; Take 1 tablet (40 mg total) by mouth daily.  Dispense: 90 tablet; Refill: 3 - metoprolol (TOPROL-XL) 200 MG 24 hr tablet; Take 1 tablet (200 mg total) by mouth daily.  Dispense: 90 tablet; Refill: 3  5. ASCVD (arteriosclerotic cardiovascular disease)  - isosorbide mononitrate (IMDUR) 30 MG 24 hr tablet; Take 1 tablet (30 mg total) by mouth daily.  Dispense: 90 tablet; Refill: 3  6.  Elevated lipids  - atorvastatin (LIPITOR) 80 MG tablet; Take 1 tablet (80 mg total) by mouth daily.  Dispense: 90 tablet; Refill: 3

## 2015-08-06 ENCOUNTER — Encounter: Payer: Self-pay | Admitting: Family Medicine

## 2015-08-06 ENCOUNTER — Other Ambulatory Visit: Payer: Self-pay | Admitting: *Deleted

## 2015-08-06 ENCOUNTER — Ambulatory Visit (INDEPENDENT_AMBULATORY_CARE_PROVIDER_SITE_OTHER): Payer: Commercial Managed Care - HMO | Admitting: Family Medicine

## 2015-08-06 VITALS — BP 110/65 | HR 62 | Resp 16 | Ht 60.0 in | Wt 142.0 lb

## 2015-08-06 DIAGNOSIS — E119 Type 2 diabetes mellitus without complications: Secondary | ICD-10-CM

## 2015-08-06 DIAGNOSIS — Z23 Encounter for immunization: Secondary | ICD-10-CM

## 2015-08-06 DIAGNOSIS — I10 Essential (primary) hypertension: Secondary | ICD-10-CM

## 2015-08-06 LAB — POCT GLYCOSYLATED HEMOGLOBIN (HGB A1C): Hemoglobin A1C: 6

## 2015-08-06 MED ORDER — AMLODIPINE BESYLATE 2.5 MG PO TABS: 2.5000 mg | ORAL_TABLET | Freq: Every day | ORAL | Status: DC

## 2015-08-06 MED ORDER — AMLODIPINE BESYLATE 2.5 MG PO TABS: 5.0000 mg | ORAL_TABLET | Freq: Every day | ORAL | Status: DC

## 2015-08-06 MED ORDER — METFORMIN HCL 500 MG PO TABS: ORAL_TABLET | ORAL | Status: DC

## 2015-08-06 NOTE — Progress Notes (Signed)
Name: Juan King   MRN: [id number]    DOB: 1950-09-25   Date:08/06/2015       Progress Note  Subjective  Chief Complaint  Chief Complaint  Patient presents with  . Diabetes    HPI Here for f/u of DM and HBP.  Losing weight.  Feeling well.  No problem-specific assessment & plan notes found for this encounter.   Past Medical History  Diagnosis Date  . Diabetes mellitus type 2, controlled (HCC)   . Screening for depression   . Neuropathy of both feet (HCC)   . Personal history of fall   . Coronary arteriosclerosis   . Psoriasis   . Hypertension   . Primary hypertension   . Seasonal allergies   . Hyperlipidemia   . Allergic rhinitis     Past Surgical History  Procedure Laterality Date  . Coronary stent placement      History reviewed. No pertinent family history.  Social History   Social History  . Marital Status: Married    Spouse Name: N/A  . Number of Children: N/A  . Years of Education: N/A   Occupational History  . Not on file.   Social History Main Topics  . Smoking status: Never Smoker   . Smokeless tobacco: Not on file  . Alcohol Use: No  . Drug Use: No  . Sexual Activity: Not on file   Other Topics Concern  . Not on file   Social History Narrative     Current outpatient prescriptions:  .  albuterol (PROVENTIL HFA;VENTOLIN HFA) 108 (90 BASE) MCG/ACT inhaler, Inhale 2 puffs into the lungs every 6 (six) hours as needed for wheezing or shortness of breath., Disp: 3 Inhaler, Rfl: 12 .  amitriptyline (ELAVIL) 10 MG tablet, Take 1 tablet (10 mg total) by mouth at bedtime., Disp: 90 tablet, Rfl: 3 .  amLODipine (NORVASC) 2.5 MG tablet, Take 2 tablets (5 mg total) by mouth daily., Disp: 90 tablet, Rfl: 3 .  aspirin EC 81 MG tablet, Take 81 mg by mouth daily., Disp: , Rfl:  .  atorvastatin (LIPITOR) 80 MG tablet, Take 1 tablet (80 mg total) by mouth daily., Disp: 90 tablet, Rfl: 3 .  cholecalciferol (VITAMIN D) 1000 UNITS tablet, Take 1,000 Units  by mouth daily., Disp: , Rfl:  .  fluticasone (FLONASE) 50 MCG/ACT nasal spray, Place 1 spray into both nostrils daily., Disp: 16 g, Rfl: 12 .  guaiFENesin (MUCINEX) 600 MG 12 hr tablet, Take 600 mg by mouth 2 (two) times daily., Disp: , Rfl:  .  isosorbide mononitrate (IMDUR) 30 MG 24 hr tablet, Take 1 tablet (30 mg total) by mouth daily., Disp: 90 tablet, Rfl: 3 .  lisinopril (PRINIVIL,ZESTRIL) 40 MG tablet, Take 1 tablet (40 mg total) by mouth daily., Disp: 90 tablet, Rfl: 3 .  metFORMIN (GLUCOPHAGE) 500 MG tablet, Take 1/2 tablet by mouth each morning., Disp: 45 tablet, Rfl: 3 .  metoprolol (TOPROL-XL) 200 MG 24 hr tablet, Take 1 tablet (200 mg total) by mouth daily., Disp: 90 tablet, Rfl: 3  Not on File   Review of Systems  Constitutional: Positive for weight loss (desired). Negative for fever, chills and malaise/fatigue.  HENT: Negative for hearing loss.   Eyes: Negative for blurred vision and double vision.  Respiratory: Negative for cough, shortness of breath and wheezing.   Cardiovascular: Negative for chest pain, palpitations and leg swelling.  Gastrointestinal: Negative for heartburn, abdominal pain and blood in stool.  Genitourinary: Negative for dysuria,  urgency and frequency.  Musculoskeletal: Negative for myalgias and joint pain.  Skin: Negative for rash.  Neurological: Negative for dizziness, tremors, weakness and headaches.      Objective  Filed Vitals:   08/06/15 1009 08/06/15 1024  BP: 111/61 110/65  Pulse: 62   Resp: 16   Height: 5' (1.524 m)   Weight: 142 lb (64.411 kg)     Physical Exam  Constitutional: He is oriented to person, place, and time and well-developed, well-nourished, and in no distress. No distress.  HENT:  Head: Normocephalic and atraumatic.  Eyes: Conjunctivae and EOM are normal. Pupils are equal, round, and reactive to light. No scleral icterus.  Neck: Normal range of motion. Neck supple. Carotid bruit is not present. No thyromegaly  present.  Cardiovascular: Normal rate, regular rhythm and normal heart sounds.  Exam reveals no gallop and no friction rub.   No murmur heard. Pulmonary/Chest: Effort normal and breath sounds normal. No respiratory distress. He has no wheezes. He has no rales.  Abdominal: Soft. Bowel sounds are normal. He exhibits no distension, no abdominal bruit and no mass. There is no tenderness.  Musculoskeletal: He exhibits no edema.  Lymphadenopathy:    He has no cervical adenopathy.  Neurological: He is alert and oriented to person, place, and time.  Vitals reviewed.      Recent Results (from the past 2160 hour(s))  POCT HgB A1C     Status: Normal   Collection Time: 08/06/15 10:15 AM  Result Value Ref Range   Hemoglobin A1C 6.0      Assessment & Plan  Problem List Items Addressed This Visit      Cardiovascular and Mediastinum   BP (high blood pressure)   Relevant Medications   amLODipine (NORVASC) 2.5 MG tablet    Other Visit Diagnoses    Type 2 diabetes mellitus without complication, without long-term current use of insulin (HCC)    -  Primary    Relevant Medications    metFORMIN (GLUCOPHAGE) 500 MG tablet    Other Relevant Orders    POCT HgB A1C (Completed)    Need for influenza vaccination        Relevant Orders    Flu vaccine HIGH DOSE PF (Fluzone High dose)       Meds ordered this encounter  Medications  . metFORMIN (GLUCOPHAGE) 500 MG tablet    Sig: Take 1/2 tablet by mouth each morning.    Dispense:  45 tablet    Refill:  3  . amLODipine (NORVASC) 2.5 MG tablet    Sig: Take 2 tablets (5 mg total) by mouth daily.    Dispense:  90 tablet    Refill:  3   1. Type 2 diabetes mellitus without complication, without long-term current use of insulin (HCC)  - POCT HgB A1C - metFORMIN (GLUCOPHAGE) 500 MG tablet; Take 1/2 tablet by mouth each morning.  Dispense: 45 tablet; Refill: 3  2. Need for influenza vaccination  - Flu vaccine HIGH DOSE PF (Fluzone High  dose)  3. Essential hypertension - amLODipine (NORVASC) 2.5 MG tablet; Take 2 tablets (5 mg total) by mouth daily.  Dispense: 90 tablet; Refill: 3

## 2015-08-06 NOTE — Patient Instructions (Signed)
Continue his current meds except as indicated.

## 2015-11-19 DIAGNOSIS — H40003 Preglaucoma, unspecified, bilateral: Secondary | ICD-10-CM | POA: Diagnosis not present

## 2015-12-05 ENCOUNTER — Telehealth: Payer: Self-pay | Admitting: Family Medicine

## 2015-12-05 ENCOUNTER — Encounter: Payer: Self-pay | Admitting: Family Medicine

## 2015-12-05 ENCOUNTER — Ambulatory Visit (INDEPENDENT_AMBULATORY_CARE_PROVIDER_SITE_OTHER): Payer: Commercial Managed Care - HMO | Admitting: Family Medicine

## 2015-12-05 VITALS — BP 190/90 | HR 73 | Temp 98.4°F | Resp 16 | Wt 137.6 lb

## 2015-12-05 DIAGNOSIS — I10 Essential (primary) hypertension: Secondary | ICD-10-CM | POA: Diagnosis not present

## 2015-12-05 DIAGNOSIS — E119 Type 2 diabetes mellitus without complications: Secondary | ICD-10-CM

## 2015-12-05 LAB — POCT GLYCOSYLATED HEMOGLOBIN (HGB A1C)

## 2015-12-05 MED ORDER — AMLODIPINE BESYLATE 5 MG PO TABS: 2.5000 mg | ORAL_TABLET | Freq: Every day | ORAL | Status: DC

## 2015-12-05 MED ORDER — CHLORTHALIDONE 25 MG PO TABS: 25.0000 mg | ORAL_TABLET | Freq: Every day | ORAL | Status: DC

## 2015-12-05 NOTE — Progress Notes (Signed)
Name: Juan King   MRN: [id number]    DOB: 19-Dec-1950   Date:12/05/2015       Progress Note  Subjective  Chief Complaint  Chief Complaint  Patient presents with  . Follow-up    Diabetes, tingling in both feet doesn't seem to be made better with medication  . Diabetes  . Hypertension    HPI Here for f/u of DM and HBP.  Checks BSs but doesn't remember any values.  He c/o some feet neuropathy bothering him.  No problem-specific assessment & plan notes found for this encounter.   Past Medical History  Diagnosis Date  . Diabetes mellitus type 2, controlled (HCC)   . Screening for depression   . Neuropathy of both feet (HCC)   . Personal history of fall   . Coronary arteriosclerosis   . Psoriasis   . Hypertension   . Primary hypertension   . Seasonal allergies   . Hyperlipidemia   . Allergic rhinitis     Past Surgical History  Procedure Laterality Date  . Coronary stent placement      History reviewed. No pertinent family history.  Social History   Social History  . Marital Status: Married    Spouse Name: N/A  . Number of Children: N/A  . Years of Education: N/A   Occupational History  . Not on file.   Social History Main Topics  . Smoking status: Never Smoker   . Smokeless tobacco: Not on file  . Alcohol Use: No  . Drug Use: No  . Sexual Activity: Not on file   Other Topics Concern  . Not on file   Social History Narrative     Current outpatient prescriptions:  .  albuterol (PROVENTIL HFA;VENTOLIN HFA) 108 (90 BASE) MCG/ACT inhaler, Inhale 2 puffs into the lungs every 6 (six) hours as needed for wheezing or shortness of breath., Disp: 3 Inhaler, Rfl: 12 .  amitriptyline (ELAVIL) 10 MG tablet, Take 1 tablet (10 mg total) by mouth at bedtime., Disp: 90 tablet, Rfl: 3 .  amLODipine (NORVASC) 5 MG tablet, Take 0.5 tablets (2.5 mg total) by mouth daily., Disp: 30 tablet, Rfl: 6 .  aspirin EC 81 MG tablet, Take 81 mg by mouth daily., Disp: , Rfl:  .   atorvastatin (LIPITOR) 80 MG tablet, Take 1 tablet (80 mg total) by mouth daily., Disp: 90 tablet, Rfl: 3 .  cholecalciferol (VITAMIN D) 1000 UNITS tablet, Take 1,000 Units by mouth daily. Reported on 12/05/2015, Disp: , Rfl:  .  fluticasone (FLONASE) 50 MCG/ACT nasal spray, Place 1 spray into both nostrils daily., Disp: 16 g, Rfl: 12 .  guaiFENesin (MUCINEX) 600 MG 12 hr tablet, Take 600 mg by mouth 2 (two) times daily., Disp: , Rfl:  .  isosorbide mononitrate (IMDUR) 30 MG 24 hr tablet, Take 1 tablet (30 mg total) by mouth daily., Disp: 90 tablet, Rfl: 3 .  lisinopril (PRINIVIL,ZESTRIL) 40 MG tablet, Take 1 tablet (40 mg total) by mouth daily., Disp: 90 tablet, Rfl: 3 .  metFORMIN (GLUCOPHAGE) 500 MG tablet, Take 1/2 tablet by mouth each morning., Disp: 45 tablet, Rfl: 3 .  metoprolol (TOPROL-XL) 200 MG 24 hr tablet, Take 1 tablet (200 mg total) by mouth daily., Disp: 90 tablet, Rfl: 3 .  chlorthalidone (HYGROTON) 25 MG tablet, Take 1 tablet (25 mg total) by mouth daily., Disp: 30 tablet, Rfl: 6  No Known Allergies   Review of Systems  Constitutional: Negative for fever, chills, weight loss and malaise/fatigue.  HENT: Negative for hearing loss.   Eyes: Negative for blurred vision and double vision.  Respiratory: Negative for cough, shortness of breath and wheezing.   Cardiovascular: Negative for chest pain, palpitations and leg swelling.  Gastrointestinal: Negative for heartburn, abdominal pain and blood in stool.  Genitourinary: Negative for dysuria, urgency and frequency.  Skin: Negative for rash.  Neurological: Negative for dizziness, tingling, tremors, weakness and headaches.      Objective  Filed Vitals:   12/05/15 1111 12/05/15 1134  BP: 209/112 190/90  Pulse: 73   Temp: 98.4 F (36.9 C)   TempSrc: Oral   Resp: 16   Weight: 137 lb 9.6 oz (62.415 kg)     Physical Exam  Constitutional: He is oriented to person, place, and time and well-developed, well-nourished, and in  no distress. No distress.  HENT:  Head: Normocephalic and atraumatic.  Eyes: Conjunctivae and EOM are normal. Pupils are equal, round, and reactive to light. No scleral icterus.  Neck: Normal range of motion. Neck supple. Carotid bruit is not present. No thyromegaly present.  Cardiovascular: Normal rate, regular rhythm and normal heart sounds.  Exam reveals no gallop and no friction rub.   No murmur heard. Pulmonary/Chest: Effort normal and breath sounds normal. No respiratory distress. He has no wheezes. He has no rales.  Abdominal: Soft. Bowel sounds are normal. He exhibits no distension, no abdominal bruit and no mass. There is no tenderness.  Musculoskeletal: He exhibits no edema.  Lymphadenopathy:    He has no cervical adenopathy.  Neurological: He is alert and oriented to person, place, and time.  Vitals reviewed.      Recent Results (from the past 2160 hour(s))  POCT HgB A1C     Status: Abnormal   Collection Time: 12/05/15 11:17 AM  Result Value Ref Range   Hemoglobin A1C 6.2 %      Assessment & Plan  Problem List Items Addressed This Visit      Cardiovascular and Mediastinum   BP (high blood pressure)   Relevant Medications   amLODipine (NORVASC) 5 MG tablet   chlorthalidone (HYGROTON) 25 MG tablet    Other Visit Diagnoses    Diabetes mellitus without complication (HCC)    -  Primary    Relevant Orders    POCT HgB A1C (Completed)       Meds ordered this encounter  Medications  . amLODipine (NORVASC) 5 MG tablet    Sig: Take 0.5 tablets (2.5 mg total) by mouth daily.    Dispense:  30 tablet    Refill:  6  . chlorthalidone (HYGROTON) 25 MG tablet    Sig: Take 1 tablet (25 mg total) by mouth daily.    Dispense:  30 tablet    Refill:  6   1. Diabetes mellitus without complication (HCC) Cont. Metformin - POCT HgB A1C-6.2  2. Essential hypertension Cont. Lisinopril, Metoprolol, Imdur. - amLODipine (NORVASC) 5 MG tablet; Take 0.5 tablets (2.5 mg total) by  mouth daily.  Dispense: 30 tablet; Refill: 6 - chlorthalidone (HYGROTON) 25 MG tablet; Take 1 tablet (25 mg total) by mouth daily.  Dispense: 30 tablet; Refill: 6  RTC 2 weeks

## 2015-12-05 NOTE — Telephone Encounter (Signed)
Called pharmacy and spoke with Tiffany. Advised her of change to pt's Amlodipine dose. Tiffany changed during conversation. cfp

## 2015-12-16 ENCOUNTER — Ambulatory Visit (INDEPENDENT_AMBULATORY_CARE_PROVIDER_SITE_OTHER): Payer: Commercial Managed Care - HMO | Admitting: Family Medicine

## 2015-12-16 ENCOUNTER — Encounter: Payer: Self-pay | Admitting: Family Medicine

## 2015-12-16 VITALS — BP 155/80 | HR 76 | Resp 16 | Ht 60.0 in | Wt 135.0 lb

## 2015-12-16 DIAGNOSIS — I10 Essential (primary) hypertension: Secondary | ICD-10-CM | POA: Diagnosis not present

## 2015-12-16 MED ORDER — AMLODIPINE BESYLATE 5 MG PO TABS: 5.0000 mg | ORAL_TABLET | Freq: Every day | ORAL | Status: DC

## 2015-12-16 NOTE — Progress Notes (Signed)
Name: Juan King   MRN: [id number]    DOB: [birth year]   Date:12/16/2015       Progress Note  Subjective  Chief Complaint  Chief Complaint  Patient presents with  . Hypertension  . Diabetes    12/11/15 bs 99 12/12/15 bs 141 47/17 bs 117    HPI Here for f/u of HBP.  His DM has been ok recently.  He is feeling pretty well.  No problem-specific assessment & plan notes found for this encounter.   Past Medical History  Diagnosis Date  . Diabetes mellitus type 2, controlled (HCC)   . Screening for depression   . Neuropathy of both feet (HCC)   . Personal history of fall   . Coronary arteriosclerosis   . Psoriasis   . Hypertension   . Primary hypertension   . Seasonal allergies   . Hyperlipidemia   . Allergic rhinitis     Past Surgical History  Procedure Laterality Date  . Coronary stent placement      History reviewed. No pertinent family history.  Social History   Social History  . Marital Status: Married    Spouse Name: N/A  . Number of Children: N/A  . Years of Education: N/A   Occupational History  . Not on file.   Social History Main Topics  . Smoking status: Never Smoker   . Smokeless tobacco: Never Used  . Alcohol Use: No  . Drug Use: No  . Sexual Activity: Not on file   Other Topics Concern  . Not on file   Social History Narrative     Current outpatient prescriptions:  .  albuterol (PROVENTIL HFA;VENTOLIN HFA) 108 (90 BASE) MCG/ACT inhaler, Inhale 2 puffs into the lungs every 6 (six) hours as needed for wheezing or shortness of breath., Disp: 3 Inhaler, Rfl: 12 .  amitriptyline (ELAVIL) 10 MG tablet, Take 1 tablet (10 mg total) by mouth at bedtime., Disp: 90 tablet, Rfl: 3 .  amLODipine (NORVASC) 5 MG tablet, Take 1 tablet (5 mg total) by mouth daily., Disp: 30 tablet, Rfl: 6 .  aspirin EC 81 MG tablet, Take 81 mg by mouth daily., Disp: , Rfl:  .  atorvastatin (LIPITOR) 80 MG tablet, Take 1 tablet (80 mg total) by mouth daily., Disp: 90  tablet, Rfl: 3 .  chlorthalidone (HYGROTON) 25 MG tablet, Take 1 tablet (25 mg total) by mouth daily., Disp: 30 tablet, Rfl: 6 .  cholecalciferol (VITAMIN D) 1000 UNITS tablet, Take 1,000 Units by mouth daily. Reported on 12/05/2015, Disp: , Rfl:  .  fluticasone (FLONASE) 50 MCG/ACT nasal spray, Place 1 spray into both nostrils daily., Disp: 16 g, Rfl: 12 .  guaiFENesin (MUCINEX) 600 MG 12 hr tablet, Take 600 mg by mouth 2 (two) times daily., Disp: , Rfl:  .  isosorbide mononitrate (IMDUR) 30 MG 24 hr tablet, Take 1 tablet (30 mg total) by mouth daily., Disp: 90 tablet, Rfl: 3 .  latanoprost (XALATAN) 0.005 % ophthalmic solution, Place 1 drop into both eyes 2 (two) times daily., Disp: , Rfl:  .  lisinopril (PRINIVIL,ZESTRIL) 40 MG tablet, Take 1 tablet (40 mg total) by mouth daily., Disp: 90 tablet, Rfl: 3 .  metFORMIN (GLUCOPHAGE) 500 MG tablet, Take 1/2 tablet by mouth each morning., Disp: 45 tablet, Rfl: 3 .  metoprolol (TOPROL-XL) 200 MG 24 hr tablet, Take 1 tablet (200 mg total) by mouth daily., Disp: 90 tablet, Rfl: 3  Not on File   Review of Systems  Constitutional: Negative for fever, chills, weight loss and malaise/fatigue.  HENT: Negative for hearing loss.   Eyes: Negative for blurred vision and double vision.  Respiratory: Negative for cough, shortness of breath and wheezing.   Cardiovascular: Negative for chest pain, palpitations and leg swelling.  Gastrointestinal: Negative for heartburn, abdominal pain and blood in stool.  Genitourinary: Negative for dysuria, urgency and frequency.  Skin: Negative for rash.  Neurological: Negative for dizziness, tremors, weakness and headaches.      Objective  Filed Vitals:   12/16/15 1007 12/16/15 1131  BP: 150/90 155/80  Pulse: 76   Resp: 16   Height: 5' (1.524 m)   Weight: 135 lb (61.236 kg)   SpO2: 98%     Physical Exam  Constitutional: He is oriented to person, place, and time and well-developed, well-nourished, and in no  distress. No distress.  HENT:  Head: Normocephalic and atraumatic.  Eyes: Conjunctivae and EOM are normal. Pupils are equal, round, and reactive to light. No scleral icterus.  Neck: Normal range of motion. Neck supple. Carotid bruit is not present. No thyromegaly present.  Cardiovascular: Normal rate, regular rhythm and normal heart sounds.  Exam reveals no gallop and no friction rub.   No murmur heard. Pulmonary/Chest: Effort normal and breath sounds normal. No respiratory distress. He has no wheezes. He has no rales.  Musculoskeletal: He exhibits no edema.  Lymphadenopathy:    He has no cervical adenopathy.  Neurological: He is alert and oriented to person, place, and time.  Vitals reviewed.      Recent Results (from the past 2160 hour(s))  POCT HgB A1C     Status: Abnormal   Collection Time: 12/05/15 11:17 AM  Result Value Ref Range   Hemoglobin A1C 6.2 %      Assessment & Plan  Problem List Items Addressed This Visit      Cardiovascular and Mediastinum   BP (high blood pressure)   Relevant Medications   amLODipine (NORVASC) 5 MG tablet   Essential (primary) hypertension - Primary   Relevant Medications   amLODipine (NORVASC) 5 MG tablet      Meds ordered this encounter  Medications  . latanoprost (XALATAN) 0.005 % ophthalmic solution    Sig: Place 1 drop into both eyes 2 (two) times daily.  Marland Kitchen amLODipine (NORVASC) 5 MG tablet    Sig: Take 1 tablet (5 mg total) by mouth daily.    Dispense:  30 tablet    Refill:  6   1. Essential (primary) hypertension   2. Essential hypertension  - amLODipine (NORVASC) 5 MG tablet; Take 1 tablet (5 mg total) by mouth daily.  Dispense: 30 tablet; Refill: 6  Cont. Metoprolol, Chlorthaladone, Lisinopril, Imdur at current doses.

## 2016-01-01 ENCOUNTER — Encounter: Payer: Self-pay | Admitting: Family Medicine

## 2016-01-01 ENCOUNTER — Ambulatory Visit (INDEPENDENT_AMBULATORY_CARE_PROVIDER_SITE_OTHER): Payer: Commercial Managed Care - HMO | Admitting: Family Medicine

## 2016-01-01 VITALS — BP 160/84 | HR 74 | Temp 98.5°F | Resp 16 | Ht 60.0 in | Wt 139.0 lb

## 2016-01-01 DIAGNOSIS — B351 Tinea unguium: Secondary | ICD-10-CM

## 2016-01-01 DIAGNOSIS — L602 Onychogryphosis: Secondary | ICD-10-CM | POA: Diagnosis not present

## 2016-01-01 DIAGNOSIS — I10 Essential (primary) hypertension: Secondary | ICD-10-CM

## 2016-01-01 DIAGNOSIS — E1140 Type 2 diabetes mellitus with diabetic neuropathy, unspecified: Secondary | ICD-10-CM

## 2016-01-01 MED ORDER — GABAPENTIN 100 MG PO CAPS: 100.0000 mg | ORAL_CAPSULE | Freq: Three times a day (TID) | ORAL | Status: DC

## 2016-01-01 MED ORDER — BLOOD PRESSURE CUFF MISC: 1.0000 | Freq: Every day | Status: DC

## 2016-01-01 MED ORDER — GABAPENTIN 100 MG PO CAPS: ORAL_CAPSULE | ORAL | Status: DC

## 2016-01-01 NOTE — Progress Notes (Signed)
Subjective:    Patient ID: Juan HakeLarry D Stegman, male    DOB: [birth year], 65 y.o.   MRN: [id number]  HPI: Juan King is a 65 y.o. male presenting on 01/01/2016 for Nail Problem   HPI  Pt presents for referral to podiatry. He has a history of diabetes and has very long overgrown toenails.  He is desiring to get the cut.  Of note pt reports he has frequent beestings and numbness in his feet.  Last A1c was 6.2%. Numbness in feet has been presents for several years. He has not been on a medication for his feet.   Pt does not check BP at home. Elevated today. No HA, no chest pain, no dizziness.   Past Medical History  Diagnosis Date  . Diabetes mellitus type 2, controlled (HCC)   . Screening for depression   . Neuropathy of both feet (HCC)   . Personal history of fall   . Coronary arteriosclerosis   . Psoriasis   . Hypertension   . Primary hypertension   . Seasonal allergies   . Hyperlipidemia   . Allergic rhinitis     Current Outpatient Prescriptions on File Prior to Visit  Medication Sig  . albuterol (PROVENTIL HFA;VENTOLIN HFA) 108 (90 BASE) MCG/ACT inhaler Inhale 2 puffs into the lungs every 6 (six) hours as needed for wheezing or shortness of breath.  Marland Kitchen. amitriptyline (ELAVIL) 10 MG tablet Take 1 tablet (10 mg total) by mouth at bedtime.  Marland Kitchen. amLODipine (NORVASC) 5 MG tablet Take 1 tablet (5 mg total) by mouth daily.  Marland Kitchen. aspirin EC 81 MG tablet Take 81 mg by mouth daily.  Marland Kitchen. atorvastatin (LIPITOR) 80 MG tablet Take 1 tablet (80 mg total) by mouth daily.  . chlorthalidone (HYGROTON) 25 MG tablet Take 1 tablet (25 mg total) by mouth daily.  . cholecalciferol (VITAMIN D) 1000 UNITS tablet Take 1,000 Units by mouth daily. Reported on 12/05/2015  . fluticasone (FLONASE) 50 MCG/ACT nasal spray Place 1 spray into both nostrils daily.  Marland Kitchen. guaiFENesin (MUCINEX) 600 MG 12 hr tablet Take 600 mg by mouth 2 (two) times daily.  . isosorbide mononitrate (IMDUR) 30 MG 24 hr tablet Take 1 tablet  (30 mg total) by mouth daily.  Marland Kitchen. latanoprost (XALATAN) 0.005 % ophthalmic solution Place 1 drop into both eyes 2 (two) times daily.  Marland Kitchen. lisinopril (PRINIVIL,ZESTRIL) 40 MG tablet Take 1 tablet (40 mg total) by mouth daily.  . metFORMIN (GLUCOPHAGE) 500 MG tablet Take 1/2 tablet by mouth each morning.  . metoprolol (TOPROL-XL) 200 MG 24 hr tablet Take 1 tablet (200 mg total) by mouth daily.   No current facility-administered medications on file prior to visit.    Review of Systems  Constitutional: Negative for fever and chills.  HENT: Negative.   Respiratory: Negative for chest tightness, shortness of breath and wheezing.   Cardiovascular: Negative for chest pain, palpitations and leg swelling.  Gastrointestinal: Negative for nausea, vomiting and abdominal pain.  Endocrine: Negative.   Genitourinary: Negative for dysuria, urgency, discharge, penile pain and testicular pain.  Musculoskeletal: Negative for back pain, joint swelling and arthralgias.  Skin: Negative.        Overgrown toe nails  Neurological: Positive for numbness (feet). Negative for dizziness, weakness and headaches.  Psychiatric/Behavioral: Negative for sleep disturbance and dysphoric mood.   Per HPI unless specifically indicated above     Objective:    BP 160/84 mmHg  Pulse 74  Temp(Src) 98.5 F (36.9 C) (Oral)  Resp 16  Ht 5' (1.524 m)  Wt 139 lb (63.05 kg)  BMI 27.15 kg/m2  Wt Readings from Last 3 Encounters:  01/01/16 139 lb (63.05 kg)  12/16/15 135 lb (61.236 kg)  12/05/15 137 lb 9.6 oz (62.415 kg)    Physical Exam  Constitutional: He is oriented to person, place, and time. He appears well-developed and well-nourished. No distress.  HENT:  Head: Normocephalic and atraumatic.  Neck: Neck supple. No thyromegaly present.  Cardiovascular: Normal rate, regular rhythm and normal heart sounds.  Exam reveals no gallop and no friction rub.   No murmur heard. Pulmonary/Chest: Effort normal and breath sounds  normal. He has no wheezes.  Abdominal: Soft. Bowel sounds are normal. He exhibits no distension. There is no tenderness. There is no rebound.  Musculoskeletal: Normal range of motion. He exhibits no edema or tenderness.  Neurological: He is alert and oriented to person, place, and time. He has normal reflexes.  Skin: Skin is warm and dry. No rash noted. No erythema.  Yellow, long, rigid nails curling over toes to plantar surface.   Psychiatric: He has a normal mood and affect. His behavior is normal. Thought content normal.   Diabetic Foot Exam - Simple   Simple Foot Form  Diabetic Foot exam was performed with the following findings:  Yes 01/01/2016  9:32 AM  Visual Inspection  See comments:  Yes  Sensation Testing  See comments:  Yes  Pulse Check  Posterior Tibialis and Dorsalis pulse intact bilaterally:  Yes  Comments  Overgrown toenails on all toes curling over onto plantar surface. No sensation to monofilament bilateral feet.       Results for orders placed or performed in visit on 12/05/15  POCT HgB A1C  Result Value Ref Range   Hemoglobin A1C 6.2 %       Assessment & Plan:   Problem List Items Addressed This Visit      Cardiovascular and Mediastinum   BP (high blood pressure)    Bp elevated. Pt does not want to increase medications today. Plan to monitor BP at home x 1 mos. BP cuff prescription given. Plan for recheck- consider increasing amlodipine. CMET today.       Relevant Medications   Blood Pressure Monitoring (BLOOD PRESSURE CUFF) MISC   Other Relevant Orders   Comprehensive metabolic panel     Endocrine   Controlled type 2 diabetes with neuropathy (HCC) - Primary    No sensation on plantar surface to monofilament. Plan to start gabapentin to improve numbness. Refer to podiatry for through foot exam. Discussed importance of good shoes and checking feet regularly.       Relevant Medications   gabapentin (NEURONTIN) 100 MG capsule   Other Relevant Orders    Ambulatory referral to Podiatry    Other Visit Diagnoses    Onychomycosis of toenail        Refer to podiatry.     Relevant Orders    Ambulatory referral to Podiatry    Overgrown nail        Refer to podiatry.     Relevant Orders    Ambulatory referral to Podiatry       Meds ordered this encounter  Medications  . DISCONTD: gabapentin (NEURONTIN) 100 MG capsule    Sig: Take 1 capsule (100 mg total) by mouth 3 (three) times daily.    Dispense:  90 capsule    Refill:  3    Order Specific Question:  Supervising Provider  Answer:  Janeann Forehand [[id number]]  . Blood Pressure Monitoring (BLOOD PRESSURE CUFF) MISC    Sig: 1 each by Does not apply route daily.    Dispense:  1 each    Refill:  0    Order Specific Question:  Supervising Provider    Answer:  Janeann Forehand [phone]  . gabapentin (NEURONTIN) 100 MG capsule    Sig: Take 1 capsule at bedtime x1 week, increase to 1 AM and 1 bedtime x1 week, increase to 1 AM, 1 PM, and 1 bedtime.    Dispense:  90 capsule    Refill:  3    Order Specific Question:  Supervising Provider    Answer:  Janeann Forehand [[id number]]      Follow up plan: Return in about 4 weeks (around 01/29/2016) for BP check. Marland Kitchen

## 2016-01-01 NOTE — Assessment & Plan Note (Signed)
Bp elevated. Pt does not want to increase medications today. Plan to monitor BP at home x 1 mos. BP cuff prescription given. Plan for recheck- consider increasing amlodipine. CMET today.

## 2016-01-01 NOTE — Assessment & Plan Note (Signed)
No sensation on plantar surface to monofilament. Plan to start gabapentin to improve numbness. Refer to podiatry for through foot exam. Discussed importance of good shoes and checking feet regularly.

## 2016-01-01 NOTE — Patient Instructions (Signed)
We will get you over the the [facility] to get your nails taken care of. I want to try a medication called Gabapentin to help with the numbness in your feet.   Take 1 capsule at bedtime- it can make you sleepy. After 1 week you can increase if needed to 1 capsule in the morning and 1 at bedtime.  After 1 week you can increase if needed to 1 capsule in the morning, 1 lunchtime, and 1 bedtime.

## 2016-01-10 ENCOUNTER — Encounter: Payer: Self-pay | Admitting: *Deleted

## 2016-01-15 ENCOUNTER — Ambulatory Visit: Payer: Self-pay | Admitting: Podiatry

## 2016-01-29 ENCOUNTER — Ambulatory Visit (INDEPENDENT_AMBULATORY_CARE_PROVIDER_SITE_OTHER): Payer: Commercial Managed Care - HMO | Admitting: Podiatry

## 2016-01-29 ENCOUNTER — Encounter: Payer: Self-pay | Admitting: Podiatry

## 2016-01-29 DIAGNOSIS — M79676 Pain in unspecified toe(s): Secondary | ICD-10-CM | POA: Diagnosis not present

## 2016-01-29 DIAGNOSIS — E1142 Type 2 diabetes mellitus with diabetic polyneuropathy: Secondary | ICD-10-CM

## 2016-01-29 DIAGNOSIS — B351 Tinea unguium: Secondary | ICD-10-CM

## 2016-01-29 NOTE — Progress Notes (Signed)
   Subjective:    Patient ID: Juan King, male    DOB: [birth year], 65 y.o.   MRN: [id number]  HPI i have some toenails that long and need to be cut and i am a diabetic and my feet swell, get cold, and they are dry, peeling, cracking and they do hurt    Review of Systems  All other systems reviewed and are negative.      Objective:   Physical Exam he presents today vital signs stable alert and oriented 3. Psoriatic-type lesions to the plantar aspect of the bilateral foot. His toenails are thick yellow dystrophic onychomycotic and painful palpation.        Assessment & Plan:  Pain in limb secondary to onychomycosis.  Plan: Debrided toenails 1 through 5 bilateral.

## 2016-01-31 ENCOUNTER — Ambulatory Visit: Payer: Commercial Managed Care - HMO | Admitting: Family Medicine

## 2016-02-13 ENCOUNTER — Encounter: Payer: Self-pay | Admitting: Family Medicine

## 2016-02-13 ENCOUNTER — Ambulatory Visit (INDEPENDENT_AMBULATORY_CARE_PROVIDER_SITE_OTHER): Payer: Commercial Managed Care - HMO | Admitting: Family Medicine

## 2016-02-13 VITALS — BP 145/75 | HR 69 | Temp 98.2°F | Resp 16 | Ht 60.0 in | Wt 137.0 lb

## 2016-02-13 DIAGNOSIS — E785 Hyperlipidemia, unspecified: Secondary | ICD-10-CM | POA: Diagnosis not present

## 2016-02-13 DIAGNOSIS — I10 Essential (primary) hypertension: Secondary | ICD-10-CM

## 2016-02-13 DIAGNOSIS — I2510 Atherosclerotic heart disease of native coronary artery without angina pectoris: Secondary | ICD-10-CM

## 2016-02-13 DIAGNOSIS — E119 Type 2 diabetes mellitus without complications: Secondary | ICD-10-CM

## 2016-02-13 NOTE — Progress Notes (Signed)
Name: Juan King   MRN: [id number]    DOB: 1950-10-17   Date:02/13/2016       Progress Note  Subjective  Chief Complaint  Chief Complaint  Patient presents with  . Hypertension    HPI Here for f/u of HBP.  HE is also diabetic and has elevated lipids, but BP is main concern today.  HE checks BSs occ, but does not remember readings.  No problem-specific assessment & plan notes found for this encounter.   Past Medical History  Diagnosis Date  . Diabetes mellitus type 2, controlled (HCC)   . Screening for depression   . Neuropathy of both feet (HCC)   . Personal history of fall   . Coronary arteriosclerosis   . Psoriasis   . Hypertension   . Primary hypertension   . Seasonal allergies   . Hyperlipidemia   . Allergic rhinitis     Past Surgical History  Procedure Laterality Date  . Coronary stent placement      History reviewed. No pertinent family history.  Social History   Social History  . Marital Status: Married    Spouse Name: N/A  . Number of Children: N/A  . Years of Education: N/A   Occupational History  . Not on file.   Social History Main Topics  . Smoking status: Never Smoker   . Smokeless tobacco: Never Used  . Alcohol Use: No  . Drug Use: No  . Sexual Activity: Not on file   Other Topics Concern  . Not on file   Social History Narrative     Current outpatient prescriptions:  .  albuterol (PROVENTIL HFA;VENTOLIN HFA) 108 (90 BASE) MCG/ACT inhaler, Inhale 2 puffs into the lungs every 6 (six) hours as needed for wheezing or shortness of breath., Disp: 3 Inhaler, Rfl: 12 .  amitriptyline (ELAVIL) 10 MG tablet, Take 1 tablet (10 mg total) by mouth at bedtime., Disp: 90 tablet, Rfl: 3 .  amLODipine (NORVASC) 5 MG tablet, Take 1 tablet (5 mg total) by mouth daily., Disp: 30 tablet, Rfl: 6 .  aspirin EC 81 MG tablet, Take 81 mg by mouth daily., Disp: , Rfl:  .  atorvastatin (LIPITOR) 80 MG tablet, Take 1 tablet (80 mg total) by mouth daily.,  Disp: 90 tablet, Rfl: 3 .  Blood Pressure Monitoring (BLOOD PRESSURE CUFF) MISC, 1 each by Does not apply route daily., Disp: 1 each, Rfl: 0 .  cholecalciferol (VITAMIN D) 1000 UNITS tablet, Take 1,000 Units by mouth daily. Reported on 12/05/2015, Disp: , Rfl:  .  fluticasone (FLONASE) 50 MCG/ACT nasal spray, Place 1 spray into both nostrils daily., Disp: 16 g, Rfl: 12 .  gabapentin (NEURONTIN) 100 MG capsule, Take 1 capsule at bedtime x1 week, increase to 1 AM and 1 bedtime x1 week, increase to 1 AM, 1 PM, and 1 bedtime., Disp: 90 capsule, Rfl: 3 .  guaiFENesin (MUCINEX) 600 MG 12 hr tablet, Take 600 mg by mouth 2 (two) times daily as needed. , Disp: , Rfl:  .  hydrochlorothiazide (HYDRODIURIL) 12.5 MG tablet, Take 12.5 mg by mouth daily., Disp: , Rfl:  .  isosorbide mononitrate (IMDUR) 30 MG 24 hr tablet, Take 1 tablet (30 mg total) by mouth daily., Disp: 90 tablet, Rfl: 3 .  latanoprost (XALATAN) 0.005 % ophthalmic solution, Place 1 drop into both eyes 2 (two) times daily., Disp: , Rfl:  .  lisinopril (PRINIVIL,ZESTRIL) 40 MG tablet, Take 1 tablet (40 mg total) by mouth daily., Disp: 90  tablet, Rfl: 3 .  metFORMIN (GLUCOPHAGE) 500 MG tablet, Take 1/2 tablet by mouth each morning., Disp: 45 tablet, Rfl: 3 .  metoprolol (TOPROL-XL) 200 MG 24 hr tablet, Take 1 tablet (200 mg total) by mouth daily., Disp: 90 tablet, Rfl: 3 .  chlorthalidone (HYGROTON) 25 MG tablet, Take 1 tablet (25 mg total) by mouth daily. (Patient not taking: Reported on 02/13/2016), Disp: 30 tablet, Rfl: 6  Not on File   Review of Systems  Constitutional: Negative for fever, chills, weight loss and malaise/fatigue.  HENT: Negative for hearing loss.   Eyes: Negative for blurred vision and double vision.  Respiratory: Negative for cough, shortness of breath and wheezing.   Cardiovascular: Negative for chest pain, palpitations and leg swelling.  Gastrointestinal: Negative for heartburn, abdominal pain and blood in stool.   Genitourinary: Negative for dysuria, urgency and frequency.  Musculoskeletal: Negative for myalgias and joint pain.  Skin: Negative for rash.  Neurological: Negative for dizziness, tremors, weakness and headaches.      Objective  Filed Vitals:   02/13/16 1037 02/13/16 1100  BP: 165/97 145/75  Pulse: 69   Temp: 98.2 F (36.8 C)   TempSrc: Oral   Resp: 16   Height: 5' (1.524 m)   Weight: 137 lb (62.143 kg)     Physical Exam  Constitutional: He is oriented to person, place, and time and well-developed, well-nourished, and in no distress. No distress.  HENT:  Head: Normocephalic and atraumatic.  Eyes: Conjunctivae and EOM are normal. Pupils are equal, round, and reactive to light. No scleral icterus.  Neck: Normal range of motion. Neck supple. Carotid bruit is not present. No thyromegaly present.  Cardiovascular: Regular rhythm and normal heart sounds.  Exam reveals no gallop and no friction rub.   No murmur heard. Pulmonary/Chest: Effort normal and breath sounds normal. No respiratory distress. He has no wheezes. He has no rales.  Musculoskeletal: He exhibits no edema.  Lymphadenopathy:    He has no cervical adenopathy.  Neurological: He is alert and oriented to person, place, and time.  Vitals reviewed.      Recent Results (from the past 2160 hour(s))  POCT HgB A1C     Status: Abnormal   Collection Time: 12/05/15 11:17 AM  Result Value Ref Range   Hemoglobin A1C 6.2 %      Assessment & Plan  Problem List Items Addressed This Visit      Cardiovascular and Mediastinum   BP (high blood pressure) - Primary   Relevant Medications   hydrochlorothiazide (HYDRODIURIL) 12.5 MG tablet   Atherosclerosis of coronary artery   Relevant Medications   hydrochlorothiazide (HYDRODIURIL) 12.5 MG tablet     Endocrine   Controlled type 2 diabetes mellitus without complication (HCC)     Other   HLD (hyperlipidemia)   Relevant Medications   hydrochlorothiazide  (HYDRODIURIL) 12.5 MG tablet      Meds ordered this encounter  Medications  . hydrochlorothiazide (HYDRODIURIL) 12.5 MG tablet    Sig: Take 12.5 mg by mouth daily.   1. Essential hypertension Cont meds  2. Atherosclerosis of native coronary artery without angina pectoris, unspecified whether native or transplanted heart Cont meds  3. Controlled type 2 diabetes mellitus without complication, without long-term current use of insulin (HCC) cont meds  4. HLD (hyperlipidemia) Cont meds

## 2016-03-06 ENCOUNTER — Encounter: Payer: Self-pay | Admitting: Family Medicine

## 2016-03-06 ENCOUNTER — Ambulatory Visit: Payer: Commercial Managed Care - HMO | Admitting: Family Medicine

## 2016-03-06 ENCOUNTER — Ambulatory Visit (INDEPENDENT_AMBULATORY_CARE_PROVIDER_SITE_OTHER): Payer: Commercial Managed Care - HMO | Admitting: Family Medicine

## 2016-03-06 VITALS — BP 180/95 | HR 75 | Temp 98.4°F | Resp 16 | Ht 60.0 in | Wt 139.4 lb

## 2016-03-06 DIAGNOSIS — E1140 Type 2 diabetes mellitus with diabetic neuropathy, unspecified: Secondary | ICD-10-CM

## 2016-03-06 DIAGNOSIS — N181 Chronic kidney disease, stage 1: Secondary | ICD-10-CM

## 2016-03-06 DIAGNOSIS — I10 Essential (primary) hypertension: Secondary | ICD-10-CM

## 2016-03-06 DIAGNOSIS — E0822 Diabetes mellitus due to underlying condition with diabetic chronic kidney disease: Secondary | ICD-10-CM

## 2016-03-06 LAB — POCT GLYCOSYLATED HEMOGLOBIN (HGB A1C): HEMOGLOBIN A1C: 6.3

## 2016-03-06 MED ORDER — LISINOPRIL 20 MG PO TABS: 40.0000 mg | ORAL_TABLET | Freq: Every day | ORAL | Status: DC

## 2016-03-06 MED ORDER — CHLORTHALIDONE 25 MG PO TABS: 25.0000 mg | ORAL_TABLET | Freq: Every day | ORAL | Status: DC

## 2016-03-06 MED ORDER — AMLODIPINE BESYLATE 10 MG PO TABS: 10.0000 mg | ORAL_TABLET | Freq: Every day | ORAL | Status: DC

## 2016-03-06 NOTE — Progress Notes (Signed)
Name: Juan King   MRN: [id number]    DOB: 1950-10-23   Date:03/06/2016       Progress Note  Subjective  Chief Complaint  Chief Complaint  Patient presents with  . Medical Clearance    pt is having dental procedure last week but B/P was high so needed clearance from PCP pt check his B/P today was 170/100    HPI Here for evaluation for dental procedure.  His BP was 190+ systolic at dentist and procedure cancelled.  There is major [facility] and if he is taking any meds.  He states that he is taking the mds that he is supposed to, but [facility], nothing for DM and 1/2 of his BP meds and said that he has not had meds filled since March.?????  No problem-specific assessment & plan notes found for this encounter.   Past Medical History  Diagnosis Date  . Diabetes mellitus type 2, controlled (HCC)   . Screening for depression   . Neuropathy of both feet (HCC)   . Personal history of fall   . Coronary arteriosclerosis   . Psoriasis   . Hypertension   . Primary hypertension   . Seasonal allergies   . Hyperlipidemia   . Allergic rhinitis     Past Surgical History  Procedure Laterality Date  . Coronary stent placement      History reviewed. No pertinent family history.  Social History   Social History  . Marital Status: Married    Spouse Name: N/A  . Number of Children: N/A  . Years of Education: N/A   Occupational History  . Not on file.   Social History Main Topics  . Smoking status: Never Smoker   . Smokeless tobacco: Never Used  . Alcohol Use: No  . Drug Use: No  . Sexual Activity: Not on file   Other Topics Concern  . Not on file   Social History Narrative     Current outpatient prescriptions:  .  amLODipine (NORVASC) 5 MG tablet, Take 1 tablet (5 mg total) by mouth daily., Disp: 30 tablet, Rfl: 6 .  Blood Pressure Monitoring (BLOOD PRESSURE CUFF) MISC, 1 each by Does not apply route daily.,  Disp: 1 each, Rfl: 0 .  chlorthalidone (HYGROTON) 25 MG tablet, Take 1 tablet (25 mg total) by mouth daily., Disp: 30 tablet, Rfl: 6 .  gabapentin (NEURONTIN) 100 MG capsule, Take 1 capsule at bedtime x1 week, increase to 1 AM and 1 bedtime x1 week, increase to 1 AM, 1 PM, and 1 bedtime., Disp: 90 capsule, Rfl: 3 .  ibuprofen (ADVIL,MOTRIN) 600 MG tablet, Take 600 mg by mouth every 6 (six) hours as needed., Disp: , Rfl:  .  isosorbide mononitrate (IMDUR) 30 MG 24 hr tablet, Take 1 tablet (30 mg total) by mouth daily., Disp: 90 tablet, Rfl: 3 .  amitriptyline (ELAVIL) 10 MG tablet, Take 1 tablet (10 mg total) by mouth at bedtime. (Patient not taking: Reported on 03/06/2016), Disp: 90 tablet, Rfl: 3 .  aspirin EC 81 MG tablet, Take 81 mg by mouth daily. Reported on 03/06/2016, Disp: , Rfl:  .  atorvastatin (LIPITOR) 80 MG tablet, Take 1 tablet (80 mg total) by mouth daily. (Patient not taking: Reported on 03/06/2016), Disp: 90 tablet, Rfl: 3 .  cholecalciferol (VITAMIN D) 1000 UNITS tablet, Take 1,000 Units by mouth daily. Reported on 03/06/2016, Disp: , Rfl:  .  hydrochlorothiazide (HYDRODIURIL) 12.5 MG tablet, Take 12.5 mg by mouth daily. Reported on 03/06/2016, Disp: , Rfl:  .  latanoprost (XALATAN) 0.005 % ophthalmic solution, Place 1 drop into both eyes 2 (two) times daily. Reported on 03/06/2016, Disp: , Rfl:  .  lisinopril (PRINIVIL,ZESTRIL) 40 MG tablet, Take 1 tablet (40 mg total) by mouth daily. (Patient not taking: Reported on 03/06/2016), Disp: 90 tablet, Rfl: 3 .  metFORMIN (GLUCOPHAGE) 500 MG tablet, Take 1/2 tablet by mouth each morning. (Patient not taking: Reported on 03/06/2016), Disp: 45 tablet, Rfl: 3 .  metoprolol (TOPROL-XL) 200 MG 24 hr tablet, Take 1 tablet (200 mg total) by mouth daily. (Patient not taking: Reported on 03/06/2016), Disp: 90 tablet, Rfl: 3  No Known Allergies   Review of Systems  Constitutional: Negative for fever, chills, weight loss and malaise/fatigue.  HENT:  Negative for hearing loss.   Eyes: Negative for blurred vision and double vision.  Respiratory: Positive for shortness of breath (mild, occ.). Negative for cough and wheezing.   Cardiovascular: Negative for chest pain, palpitations and leg swelling.  Gastrointestinal: Negative for heartburn, abdominal pain and blood in stool.  Genitourinary: Negative for dysuria, urgency and frequency.  Skin: Negative for rash.  Neurological: Negative for weakness and headaches.      Objective  Filed Vitals:   03/06/16 0941 03/06/16 0947  BP: 169/91 190/89  Pulse: 77 75  Temp: 98.4 F (36.9 C)   TempSrc: Oral   Resp: 16   Height: 5' (1.524 m)   Weight: 139 lb 6.4 oz (63.231 kg)     Physical Exam  Constitutional: He is oriented to person, place, and time and well-developed, well-nourished, and in no distress. No distress.  HENT:  Head: Normocephalic and atraumatic.  Eyes: Conjunctivae and EOM are normal. Pupils are equal, round, and reactive to light. No scleral icterus.  Neck: Normal range of motion. Neck supple. Carotid bruit is not present. No thyromegaly present.  Cardiovascular: Normal rate, regular rhythm and normal heart sounds.  Exam reveals no gallop and no friction rub.   No murmur heard. Pulmonary/Chest: Effort normal and breath sounds normal. No respiratory distress. He has no wheezes. He has no rales.  Musculoskeletal: He exhibits no edema.  Lymphadenopathy:    He has no cervical adenopathy.  Neurological: He is alert and oriented to person, place, and time.  Vitals reviewed.      Recent Results (from the past 2160 hour(s))  POCT HgB A1C     Status: Normal   Collection Time: 03/06/16 10:25 AM  Result Value Ref Range   Hemoglobin A1C 6.3      Assessment & Plan  Problem List Items Addressed This Visit      Endocrine   Controlled type 2 diabetes with neuropathy (HCC) - Primary   Relevant Orders   POCT HgB A1C (Completed)      Meds ordered this encounter   Medications  . ibuprofen (ADVIL,MOTRIN) 600 MG tablet    Sig: Take 600 mg by mouth every 6 (six) hours as needed.

## 2016-03-06 NOTE — Patient Instructions (Signed)
Delay dental work because of BP elevation.

## 2016-03-20 ENCOUNTER — Other Ambulatory Visit: Payer: Self-pay | Admitting: *Deleted

## 2016-03-20 DIAGNOSIS — I10 Essential (primary) hypertension: Secondary | ICD-10-CM
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
## 2016-03-20 NOTE — Patient Outreach (Signed)
[facility](THN) Care Management  03/20/2016  Juan King [birth year] [id number]   Subjective: Telephone call to patient's home number, no answer, left HIPAA compliant voicemail message, and requested call back.  Objective: Per chart review: Patient has had no recent hospitalizations or ED visits.  Patient's last office visit with primary MD (Dr. Fidel LevyJames Hawkins Jr. ) was on  03/06/16.     Assessment: Received MD referral on 03/09/16.  Referral source: Huey RomansSonya Carter [phone]( [phone]).    Diagnosis: Diabetes and Hypertension.   Referral reason: Medication management and social.    Telephone screen pending, patient contact.   Plan: RNCM will call patient for 2nd attempt telephone outreach, telephone screen, within 2 weeks, if no return call.   Kinslei Labine H. Gardiner Barefootooper RN, BSN, CCM [facility] Care Management [facility] Telephonic CM Phone: [phone] Fax: [phone]

## 2016-03-23 ENCOUNTER — Ambulatory Visit: Payer: Self-pay | Admitting: *Deleted

## 2016-03-23 ENCOUNTER — Other Ambulatory Visit: Payer: Self-pay | Admitting: *Deleted

## 2016-03-23 DIAGNOSIS — L400 Psoriasis vulgaris: Secondary | ICD-10-CM | POA: Diagnosis not present

## 2016-03-23 NOTE — Patient Outreach (Signed)
First attempt made to contact pt, follow up on referral from [facility] tele[facility] RN CM Darlene.   Per referrral- pt needs help with  care coordination of specialist appointment, disease education/monitoring/management of Hypertension, diabetes.    HIPAA compliant voice message left with contact name and number, if no response will try again.     [name]   Pierzchala RN CCM [facility] Care Management  [phone]

## 2016-03-25 ENCOUNTER — Other Ambulatory Visit: Payer: Self-pay | Admitting: *Deleted

## 2016-03-25 ENCOUNTER — Ambulatory Visit: Payer: Self-pay | Admitting: *Deleted

## 2016-03-25 NOTE — Patient Outreach (Signed)
Second attempt made to contact pt, follow up on referral received from [facility] RN CM Darlene (care coordination of specialist appointment, disease education/monitoring/management of HTN, DM). HIPAA compliant voice message left with contact name and number.  If no response, will try again next week.    [name]   Pierzchala RN CCM [facility] Care Management  [phone]

## 2016-03-31 ENCOUNTER — Telehealth: Payer: Self-pay | Admitting: Pharmacist

## 2016-03-31 NOTE — Patient Outreach (Signed)
[facility]) Care Management  03/31/2016  PRAKASH MARKUM [birth year] [id number]  [facility] CM Pharmacy received a referral from Agustin Cree [facility] RN Telephonic for medication review/medication management.   Attempted to reach patient via phone today, no answer.  Left a HIPAA compliant message requesting a call back.   Plan:  Will make a second attempt to reach patient within the next week if no return call.    [name], PharmD, [facility] Clinical Pharmacist [facility] [phone]

## 2016-04-01 ENCOUNTER — Other Ambulatory Visit: Payer: Self-pay | Admitting: *Deleted

## 2016-04-01 NOTE — Patient Outreach (Signed)
[facility]) Care Management  04/01/2016  NASZIER KAYLOR 06-11-1951 [id number]   Phone call to patient following referral from [facility] to provide community resources for in home assistance. HIPPA compliant voicemail message left for a return call.    [name] [facility] Care Management [phone]

## 2016-04-03 ENCOUNTER — Other Ambulatory Visit: Payer: Self-pay | Admitting: *Deleted

## 2016-04-03 ENCOUNTER — Ambulatory Visit: Payer: Self-pay | Admitting: *Deleted

## 2016-04-03 NOTE — Patient Outreach (Signed)
Third attempt made to contact pt, follow up on referral from [facility] telephonic RN CM (care coordination of specialist appointment, disease education/monitoring of Hypertension, Diabetes).   HIPAA compliant voice message left with contact name and number.    Plan: if no response to voice message left today, plan to sent an unable to contact letter next week - if no response to letter in 10 business days- to close case.    Shayne Alken.   Crystalle Popwell RN CCM [facility] Care Management  [phone]

## 2016-04-06 ENCOUNTER — Encounter: Payer: Self-pay | Admitting: *Deleted

## 2016-04-06 ENCOUNTER — Other Ambulatory Visit: Payer: Self-pay | Admitting: Pharmacist

## 2016-04-06 NOTE — Patient Outreach (Signed)
[facility]) Care Management  04/06/2016  ANIR ROSENER [birth year] [id number]  Second unsuccessful attempt to contact patient who was referred to [facility] CM Pharmacy for medication review and education.    No answer, HIPAA compliant voicemail left requesting a call back.    Noted that per chart, [facility] LCSW and [facility] RN Community have also been unsuccessful reaching patient.  Plan:    Will make a third attempt to reach patient within the next week.   [name], PharmD, [facility] Clinical Pharmacist [facility] [phone]

## 2016-04-07 ENCOUNTER — Telehealth: Payer: Self-pay | Admitting: *Deleted

## 2016-04-07 NOTE — Patient Outreach (Signed)
Error.   Rose M.   Pierzchala RN CCM [facility] Care Management  [phone]  

## 2016-04-09 ENCOUNTER — Other Ambulatory Visit: Payer: Self-pay | Admitting: *Deleted

## 2016-04-09 NOTE — Patient Outreach (Signed)
Received a return phone call from pt's friend [name] to voice message left earlier by RN CM (received a call yesterday from phone # [phone], no message left).   This RN CM requested to talk to pt-  HIPAA verified by pt plus  permission given from pt to speak with friend [name].  RN CM informed pt's friend three phone call attempts were made to follow up on referral received for pt  from coworker (telephonic RN CM), no response, so unable to contact letter was mailed.   [name] reports he does not answer calls that he does not know.  [name] reports pt still needs help with his hypertension and diabetes, pt did not follow up with specialist yet.  [name] states pt is to see Dr. Juanetta Gosling 8/10.   Pt's friend reports pt lives with him.      Plan:  As discussed with pt's friend, plan to do a home visit with pt next week.     Shayne Alken.   Pierzchala RN CCM [facility] Care Management  [phone]

## 2016-04-10 ENCOUNTER — Other Ambulatory Visit: Payer: Self-pay | Admitting: Pharmacist

## 2016-04-10 NOTE — Patient Outreach (Addendum)
[facility]) Care Management  04/10/2016  Juan King Aug 06, 1951 [id number]  Unsuccessful phone outreach attempt made to contact patient.  This was the third unsuccessful attempt and a HIPAA compliant voicemail was left requesting return call.   Did receive an in-basket from [facility] RN [facility] Okey Dupre, that she has a home visit scheduled next week after being able to get in touch with patient/his friend.    Plan:  [facility] RN Community, Rose, an in-basket that I was unable to reach patient again.   Will make a final phone outreach attempt next week.  If no reply or unable to reach patient, will send outreach letter to patient.   [name], PharmD, [facility] Clinical Pharmacist [facility] [phone]  Addendum:  Received a return call from [phone], and person calling identified himself as Juan King, patient's friend.  Patient has given verbal permission at this time to speak with his friend, Juan King about health needs, and HIPAA details were verified.   Explained that purpose of call was to review patient's medications.  Offered to review medications over phone or in person, and it was thought a home visit would be more beneficial.   Plan:   Home visit scheduled for next week to review medications.   [name], PharmD, [facility] Clinical Pharmacist [facility] [phone]

## 2016-04-14 ENCOUNTER — Other Ambulatory Visit: Payer: Self-pay | Admitting: *Deleted

## 2016-04-14 ENCOUNTER — Encounter: Payer: Self-pay | Admitting: *Deleted

## 2016-04-14 NOTE — Patient Outreach (Addendum)
[facility]) Care Management   04/14/2016  DEJAN ANGERT Jan 27, 1951 [id number]  DARIEL BETZER is an 65 y.o. male  Subjective:   Friend [name] reports pt is only on a few medications now,  MD took him Off  several  at last office visit-  BP was elevated.  Friend reports pt to f/u with MD 8/10, will see then what medications will be added.   Friend reports pt  was suppose to have his teeth pulled but is on hold now until BP is down.   Pt reports he deals with pain in fingers (tingling).  Friend reports pt did see Dermatologist about rash on arms, was given a cream to use to which pt said he is still using.    Objective:  Vitals:   04/14/16 1406 04/14/16 1513  BP: (!) 180/110 (!) 182/110  Pulse: 83   Resp: 20      ROS  Physical Exam  Encounter Medications:   Outpatient Encounter Prescriptions as of 04/14/2016  Medication Sig Note  . amLODipine (NORVASC) 10 MG tablet Take 1 tablet (10 mg total) by mouth daily.   . chlorthalidone (HYGROTON) 25 MG tablet Take 1 tablet (25 mg total) by mouth daily.   Marland Kitchen latanoprost (XALATAN) 0.005 % ophthalmic solution Place 1 drop into both eyes 2 (two) times daily. Reported on 03/06/2016 12/16/2015: Received from: [facility] Received Sig:   . lisinopril (PRINIVIL,ZESTRIL) 20 MG tablet Take 2 tablets (40 mg total) by mouth daily.   Marland Kitchen triamcinolone cream (KENALOG) 0.1 % Apply 1 application topically 2 (two) times daily. Apply twice a day until clear.   Marland Kitchen amitriptyline (ELAVIL) 10 MG tablet Take 1 tablet (10 mg total) by mouth at bedtime. (Patient not taking: Reported on 03/06/2016)   . aspirin EC 81 MG tablet Take 81 mg by mouth daily. Reported on 03/06/2016 04/17/2015: Received from: [facility] Received Sig: Take by mouth.  Marland Kitchen atorvastatin (LIPITOR) 80 MG tablet Take 1 tablet (80 mg total) by mouth daily. (Patient not taking: Reported on 03/06/2016)   . Blood Pressure Monitoring (BLOOD PRESSURE CUFF)  MISC 1 each by Does not apply route daily. (Patient not taking: Reported on 04/14/2016)   . cholecalciferol (VITAMIN D) 1000 UNITS tablet Take 1,000 Units by mouth daily. Reported on 03/06/2016   . gabapentin (NEURONTIN) 100 MG capsule Take 1 capsule at bedtime x1 week, increase to 1 AM and 1 bedtime x1 week, increase to 1 AM, 1 PM, and 1 bedtime. (Patient not taking: Reported on 04/14/2016)   . hydrochlorothiazide (HYDRODIURIL) 12.5 MG tablet Take 12.5 mg by mouth daily. Reported on 03/06/2016   . ibuprofen (ADVIL,MOTRIN) 600 MG tablet Take 600 mg by mouth every 6 (six) hours as needed.   . isosorbide mononitrate (IMDUR) 30 MG 24 hr tablet Take 1 tablet (30 mg total) by mouth daily. (Patient not taking: Reported on 04/14/2016)   . metFORMIN (GLUCOPHAGE) 500 MG tablet Take 1/2 tablet by mouth each morning. (Patient not taking: Reported on 03/06/2016)   . metoprolol (TOPROL-XL) 200 MG 24 hr tablet Take 1 tablet (200 mg total) by mouth daily. (Patient not taking: Reported on 03/06/2016)    No facility-administered encounter medications on file as of 04/14/2016.     Functional Status:   In your present state of health, do you have any difficulty performing the following activities: 04/14/2016 08/06/2015  Hearing? Y N  Vision? Y N  Difficulty concentrating or making decisions? [name]  N  Walking or climbing stairs? N Y  Dressing or bathing? N Y  Doing errands, shopping? Malvin JohnsY Y  Preparing Food and eating ? Y -  Using the Toilet? N -  In the past six months, have you accidently leaked urine? N -  Do you have problems with loss of bowel control? N -  Managing your Medications? N -  Managing your Finances? Y -  Housekeeping or managing your Housekeeping? N -  Some recent data might be hidden    Fall/Depression Screening:    PHQ 2/9 Scores 04/14/2016 08/06/2015 04/17/2015  PHQ - 2 Score 0 0 0    Assessment:  Pleasant 65 year old gentleman, lives with friend Rosamaria LintsRonald Poole in his home.                           Friend  assists with pt's health, takes to all MD appointments.                           HTN- BP today 182/110 right arm, 180/110 left arm (both readings pt sitting).                           No c/o headache.  Pain reported in fingers (tingling)- +2 on pain score.                          DM- sugar checked during home visit, result 121 (approximately 3 hours after                                Eating.   Plan:  RN CM to call Dr. Juanetta GoslingHawkins office- report pt's  high BP readings             Pt to follow up with Dr. Juanetta GoslingHawkins 8/10- office visit.            As discussed, friend to purchase BP machine for pt, pt/friend to start checking BP,                Record, call MD for elevated readings.             Plan to continue to provide pt with community nurse case management services,               Follow up again next month- home visit.              Plan to inform Dr. Juanetta [facility]lingHawkins of [facility] involvement- send by in basket barrier letter, home                 Visit encounter.    [facility] CM Care Plan Problem One   Flowsheet Row Most Recent Value  Care Plan Problem One  Hypertension- uncontrolled   Role Documenting the Problem One  Care Management Coordinator  Care Plan for Problem One  Active  [facility] Long Term Goal (31-90 days)  Pt's BP would be within normal readings within the next 60 days   [facility] Long Term Goal Start Date  04/14/16  Interventions for Problem One Long Term Goal  Provided pt with Emmi information on HTN:  HTN-health problems, HTN - What you can do.   [facility] CM Short Term Goal #1 (0-30 days)  Pt would purchase BP machine,  start  checking within the next  30 days   [facility] CM Short Term Goal #1 Start Date  04/14/16  Interventions for Short Term Goal #1  Discussed with pt and his friend importance of monitoring BP, record, call MD for abnormal readings.   [facility] CM Short Term Goal #2 (0-30 days)  Pt's knowledge of Low Na+ diet would increase in the next 30 days   [facility] CM Short Term Goal #2 Start Date  04/14/16   Interventions for Short Term Goal #2  Provided/reviewed with pt and his friend Low Na+ diet, foods to choose from      [facility].   Jaslynn Thome RN CCM [facility] Care Management  [phone]

## 2016-04-14 NOTE — Patient Outreach (Signed)
This RN CM called Dr. Fidel LevyJames Hawkins Jr. Office, spoke with Juan King, provided pt's name and date of birth.  Requested message be left with MD's nurse to inform MD of pt's elevated BP today (obtained during home visit- 182/110 left arm, 180/110 right arm, both at rest).  Juan King reports to relay message to MD.    Note:  Pt to follow up with Dr. Juanetta King 8/10- office visit.    Plan to follow up with pt again next month- home visit.     Juan Alkenose M.   Juan Szczerba RN CCM [facility] Care Management  [phone]

## 2016-04-15 ENCOUNTER — Other Ambulatory Visit: Payer: Self-pay | Admitting: *Deleted

## 2016-04-15 ENCOUNTER — Ambulatory Visit: Payer: Self-pay | Admitting: Pharmacist

## 2016-04-15 NOTE — Patient Outreach (Signed)
Note- updated consent obtained today from pt.   HIPAA verified prior to consent.    [name]   Pierzchala RN CCM [facility] Care Management  [phone]

## 2016-04-16 ENCOUNTER — Ambulatory Visit (INDEPENDENT_AMBULATORY_CARE_PROVIDER_SITE_OTHER): Payer: Commercial Managed Care - HMO | Admitting: Family Medicine

## 2016-04-16 ENCOUNTER — Encounter: Payer: Self-pay | Admitting: Family Medicine

## 2016-04-16 ENCOUNTER — Other Ambulatory Visit: Payer: Self-pay | Admitting: *Deleted

## 2016-04-16 ENCOUNTER — Other Ambulatory Visit: Payer: Self-pay | Admitting: Pharmacist

## 2016-04-16 DIAGNOSIS — E1140 Type 2 diabetes mellitus with diabetic neuropathy, unspecified: Secondary | ICD-10-CM | POA: Diagnosis not present

## 2016-04-16 DIAGNOSIS — I10 Essential (primary) hypertension: Secondary | ICD-10-CM

## 2016-04-16 DIAGNOSIS — E785 Hyperlipidemia, unspecified: Secondary | ICD-10-CM | POA: Diagnosis not present

## 2016-04-16 DIAGNOSIS — E119 Type 2 diabetes mellitus without complications: Secondary | ICD-10-CM

## 2016-04-16 MED ORDER — ATORVASTATIN CALCIUM 80 MG PO TABS: 80.0000 mg | ORAL_TABLET | Freq: Every day | ORAL | 3 refills | Status: DC

## 2016-04-16 MED ORDER — METFORMIN HCL 500 MG PO TABS: ORAL_TABLET | ORAL | 3 refills | Status: DC

## 2016-04-16 MED ORDER — GABAPENTIN 100 MG PO CAPS: ORAL_CAPSULE | ORAL | 3 refills | Status: AC

## 2016-04-16 MED ORDER — CARVEDILOL 3.125 MG PO TABS: 3.1250 mg | ORAL_TABLET | Freq: Two times a day (BID) | ORAL | 6 refills | Status: DC

## 2016-04-16 MED ORDER — LISINOPRIL 40 MG PO TABS: 40.0000 mg | ORAL_TABLET | Freq: Every day | ORAL | 3 refills | Status: DC
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
## 2016-04-16 NOTE — Progress Notes (Signed)
Name: Juan King   MRN: [id number]    DOB: 03/18/1951   Date:04/16/2016       Progress Note  Subjective  Chief Complaint  Chief Complaint  Patient presents with  . Hypertension  . Diabetes    HPI Here for f/u of HBP and DM. He is taking Lisinopril 40 mg/d, Amlodipine 10 mg/d, and Chlorthalidone, 25 mg/d.  He is not taking Metformin at this time.  Gets SOB with bending over or with activity.  He brings many other meds with him that he has taken in the past.  He had not taken any meds before coming in this morning. No problem-specific Assessment & Plan notes found for this encounter.   Past Medical History:  Diagnosis Date  . Allergic rhinitis   . Coronary arteriosclerosis   . Diabetes mellitus type 2, controlled (HCC)   . Hyperlipidemia   . Hypertension   . Neuropathy of both feet (HCC)   . Personal history of fall   . Primary hypertension   . Psoriasis   . Screening for depression   . Seasonal allergies     Past Surgical History:  Procedure Laterality Date  . CORONARY STENT PLACEMENT      Family History  Problem Relation Age of Onset  . Diabetes Mother   . Hypertension Mother   . Diabetes Father   . Vision loss Father   . Hypertension Father   . Diabetes Sister     Social History   Social History  . Marital status: Married    Spouse name: N/A  . Number of children: N/A  . Years of education: N/A   Occupational History  . Not on file.   Social History Main Topics  . Smoking status: Never Smoker  . Smokeless tobacco: Never Used  . Alcohol use No  . Drug use: No  . Sexual activity: Not on file   Other Topics Concern  . Not on file   Social History Narrative  . No narrative on file     Current Outpatient Prescriptions:  .  amLODipine (NORVASC) 10 MG tablet, Take 1 tablet (10 mg total) by mouth daily., Disp: 30 tablet, Rfl: 6 .  chlorthalidone (HYGROTON) 25 MG tablet, Take 1 tablet (25 mg total) by mouth daily., Disp: 30 tablet, Rfl: 6 .   lisinopril (PRINIVIL,ZESTRIL) 40 MG tablet, Take 1 tablet (40 mg total) by mouth daily., Disp: 90 tablet, Rfl: 3 .  amitriptyline (ELAVIL) 10 MG tablet, Take 1 tablet (10 mg total) by mouth at bedtime. (Patient not taking: Reported on 04/16/2016), Disp: 90 tablet, Rfl: 3 .  aspirin EC 81 MG tablet, Take 81 mg by mouth daily. Reported on 03/06/2016, Disp: , Rfl:  .  atorvastatin (LIPITOR) 80 MG tablet, Take 1 tablet (80 mg total) by mouth daily., Disp: 90 tablet, Rfl: 3 .  Blood Pressure Monitoring (BLOOD PRESSURE CUFF) MISC, 1 each by Does not apply route daily. (Patient not taking: Reported on 04/14/2016), Disp: 1 each, Rfl: 0 .  carvedilol (COREG) 3.125 MG tablet, Take 1 tablet (3.125 mg total) by mouth 2 (two) times daily with a meal., Disp: 60 tablet, Rfl: 6 .  cholecalciferol (VITAMIN D) 1000 UNITS tablet, Take 1,000 Units by mouth daily. Reported on 03/06/2016, Disp: , Rfl:  .  gabapentin (NEURONTIN) 100 MG capsule, Take 1 capsule 3 times a day., Disp: 90 capsule, Rfl: 3 .  ibuprofen (ADVIL,MOTRIN) 600 MG tablet, Take 600 mg by mouth every 6 (six) hours as  needed., Disp: , Rfl:  .  isosorbide mononitrate (IMDUR) 30 MG 24 hr tablet, Take 1 tablet (30 mg total) by mouth daily. (Patient not taking: Reported on 04/14/2016), Disp: 90 tablet, Rfl: 3 .  latanoprost (XALATAN) 0.005 % ophthalmic solution, Place 1 drop into both eyes 2 (two) times daily. Reported on 03/06/2016, Disp: , Rfl:  .  metFORMIN (GLUCOPHAGE) 500 MG tablet, Take 1/2 tablet by mouth each morning., Disp: 45 tablet, Rfl: 3 .  triamcinolone cream (KENALOG) 0.1 %, Apply 1 application topically 2 (two) times daily. Apply twice a day until clear., Disp: , Rfl:   Not on File   Review of Systems  Constitutional: Negative for chills, fever, malaise/fatigue and weight loss.  HENT: Negative for hearing loss.   Eyes: Negative for blurred vision and double vision.  Respiratory: Positive for cough (occ.) and shortness of breath (with activity).  Negative for wheezing.   Cardiovascular: Negative for chest pain, palpitations and leg swelling.  Gastrointestinal: Negative for abdominal pain, blood in stool and heartburn.  Genitourinary: Negative for dysuria, frequency and urgency.  Musculoskeletal: Positive for back pain (occ.). Negative for myalgias.  Skin: Negative for rash.  Neurological: Negative for dizziness, tremors, weakness and headaches.      Objective  Vitals:   04/16/16 0814 04/16/16 0912 04/16/16 0915  BP: (!) 196/100 (!) 165/80   Pulse: 73 60 72  Resp: 16    Temp: 98 F (36.7 C)    TempSrc: Oral    Weight: 134 lb 12.8 oz (61.1 kg)    Height: 5' (1.524 m)      Physical Exam  Constitutional: He is oriented to person, place, and time and well-developed, well-nourished, and in no distress. No distress.  HENT:  Head: Normocephalic and atraumatic.  Eyes: Conjunctivae and EOM are normal. Pupils are equal, round, and reactive to light. No scleral icterus.  Neck: Normal range of motion. Neck supple. Carotid bruit is not present. No thyromegaly present.  Cardiovascular: Normal rate, regular rhythm and normal heart sounds.  Exam reveals no gallop and no friction rub.   No murmur heard. Pulmonary/Chest: Effort normal and breath sounds normal. No respiratory distress. He has no wheezes. He has no rales.  Musculoskeletal: He exhibits no edema.  Lymphadenopathy:    He has no cervical adenopathy.  Neurological: He is alert and oriented to person, place, and time.  Vitals reviewed.      Recent Results (from the past 2160 hour(s))  POCT HgB A1C     Status: Normal   Collection Time: 03/06/16 10:25 AM  Result Value Ref Range   Hemoglobin A1C 6.3      Assessment & Plan  Problem List Items Addressed This Visit      Cardiovascular and Mediastinum   BP (high blood pressure)   Relevant Medications   atorvastatin (LIPITOR) 80 MG tablet   lisinopril (PRINIVIL,ZESTRIL) 40 MG tablet   carvedilol (COREG) 3.125 MG  tablet     Endocrine   Controlled type 2 diabetes with neuropathy (HCC)   Relevant Medications   atorvastatin (LIPITOR) 80 MG tablet   gabapentin (NEURONTIN) 100 MG capsule   lisinopril (PRINIVIL,ZESTRIL) 40 MG tablet   metFORMIN (GLUCOPHAGE) 500 MG tablet   Diabetes mellitus (HCC)   Relevant Medications   atorvastatin (LIPITOR) 80 MG tablet   lisinopril (PRINIVIL,ZESTRIL) 40 MG tablet   metFORMIN (GLUCOPHAGE) 500 MG tablet    Other Visit Diagnoses    Elevated lipids       Relevant Medications  atorvastatin (LIPITOR) 80 MG tablet      Meds ordered this encounter  Medications  . atorvastatin (LIPITOR) 80 MG tablet    Sig: Take 1 tablet (80 mg total) by mouth daily.    Dispense:  90 tablet    Refill:  3  . gabapentin (NEURONTIN) 100 MG capsule    Sig: Take 1 capsule 3 times a day.    Dispense:  90 capsule    Refill:  3  . lisinopril (PRINIVIL,ZESTRIL) 40 MG tablet    Sig: Take 1 tablet (40 mg total) by mouth daily.    Dispense:  90 tablet    Refill:  3  . metFORMIN (GLUCOPHAGE) 500 MG tablet    Sig: Take 1/2 tablet by mouth each morning.    Dispense:  45 tablet    Refill:  3  . carvedilol (COREG) 3.125 MG tablet    Sig: Take 1 tablet (3.125 mg total) by mouth 2 (two) times daily with a meal.    Dispense:  60 tablet    Refill:  6   1. Elevated lipids  - atorvastatin (LIPITOR) 80 MG tablet; Take 1 tablet (80 mg total) by mouth daily.  Dispense: 90 tablet; Refill: 3-restart 2. Controlled type 2 diabetes with neuropathy (HCC)  - gabapentin (NEURONTIN) 100 MG capsule; Take 1 capsule 3 times a day.  Dispense: 90 capsule; Refill: 3-restaart  3. Essential hypertension Cont chlorthalidone and amlodipine - lisinopril (PRINIVIL,ZESTRIL) 40 MG tablet; Take 1 tablet (40 mg total) by mouth daily.  Dispense: 90 tablet; Refill: 3 - carvedilol (COREG) 3.125 MG tablet; Take 1 tablet (3.125 mg total) by mouth 2 (two) times daily with a meal.  Dispense: 60 tablet; Refill: 6  4.  Type 2 diabetes mellitus without complication, without long-term current use of insulin (HCC)  - metFORMIN (GLUCOPHAGE) 500 MG tablet; Take 1/2 tablet by mouth each morning.  Dispense: 45 tablet; Refill: 3-restart

## 2016-04-16 NOTE — Patient Outreach (Signed)
[facility]) Care Management  [facility] Pharmacy   04/16/2016  Juan King [birth year] [id number]  Subjective:  Juan King is a 65 year old male who was referred to [facility] by Juan Fare RN Telephonic for medication review and caregiver education.      Home visit was completed in conjunction with [facility] LCSW, Juan L.    Patient verified his HIPAA details and his friend/caregiver Juan King was present during visit per patient request.  Patient gave consent to discuss his medical/health needs with his friend, Juan King.    Patient brought a bag of medication out and states that he uses a single, daily pill planner.  He reports that he has only been taking lisinopril 40 mg, amlodipine 10 mg, and chlorthalidone 25 mg daily.    Patient and Juan King state that at PCP appointment this morning new prescriptions were issued and have not been picked up from pharmacy yet.    Patient states that he is interested in a weekly pill planner to better assist him in managing his medications.   Patient and Juan King deny affordability issues for patient's medications at this time.  Patient and Juan King state they would be interested in using a weekly pill planner.    Objective:   Encounter Medications: Outpatient Encounter Prescriptions as of 04/16/2016  Medication Sig Note  . amitriptyline (ELAVIL) 10 MG tablet Take 1 tablet (10 mg total) by mouth at bedtime. (Patient not taking: Reported on 04/16/2016)   . amLODipine (NORVASC) 10 MG tablet Take 1 tablet (10 mg total) by mouth daily.   Marland Kitchen aspirin EC 81 MG tablet Take 81 mg by mouth daily. Reported on 03/06/2016 04/17/2015: Received from: [facility] Received Sig: Take by mouth.  Marland Kitchen atorvastatin (LIPITOR) 80 MG tablet Take 1 tablet (80 mg total) by mouth daily.   . Blood Pressure Monitoring (BLOOD PRESSURE CUFF) MISC 1 each by Does not apply route daily. (Patient not taking: Reported on 04/14/2016)   . carvedilol  (COREG) 3.125 MG tablet Take 1 tablet (3.125 mg total) by mouth 2 (two) times daily with a meal.   . chlorthalidone (HYGROTON) 25 MG tablet Take 1 tablet (25 mg total) by mouth daily.   . cholecalciferol (VITAMIN D) 1000 UNITS tablet Take 1,000 Units by mouth daily. Reported on 03/06/2016   . gabapentin (NEURONTIN) 100 MG capsule Take 1 capsule 3 times a day.   . ibuprofen (ADVIL,MOTRIN) 600 MG tablet Take 600 mg by mouth every 6 (six) hours as needed.   . isosorbide mononitrate (IMDUR) 30 MG 24 hr tablet Take 1 tablet (30 mg total) by mouth daily. (Patient not taking: Reported on 04/14/2016)   . latanoprost (XALATAN) 0.005 % ophthalmic solution Place 1 drop into both eyes 2 (two) times daily. Reported on 03/06/2016 12/16/2015: Received from: [facility] Received Sig:   . lisinopril (PRINIVIL,ZESTRIL) 40 MG tablet Take 1 tablet (40 mg total) by mouth daily.   . metFORMIN (GLUCOPHAGE) 500 MG tablet Take 1/2 tablet by mouth each morning.   . triamcinolone cream (KENALOG) 0.1 % Apply 1 application topically 2 (two) times daily. Apply twice a day until clear.    No facility-administered encounter medications on file as of 04/16/2016.     Functional Status: In your present state of health, do you have any difficulty performing the following activities: 04/14/2016 08/06/2015  Hearing? Y N  Vision? Y N  Difficulty concentrating or making decisions? Y N  Walking or  climbing stairs? N Y  Dressing or bathing? N Y  Doing errands, shopping? Juan JohnsY Y  Preparing Food and eating ? Y -  Using the Toilet? N -  In the past six months, have you accidently leaked urine? N -  Do you have problems with loss of bowel control? N -  Managing your Medications? N -  Managing your Finances? Y -  Housekeeping or managing your Housekeeping? N -  Some recent data might be hidden    Fall/Depression Screening: PHQ 2/9 Scores 04/16/2016 04/14/2016 08/06/2015 04/17/2015  PHQ - 2 Score 0 0 0 0     Assessment:  Patient does appear to be receiving [facility] costs.    Drugs sorted by system:  Neurologic/Psychologic: -amitriptyline---on medication list and in patient possession, last refill date on bottle 05/11/2014   Cardiovascular: -amlodipine 5 mg and 10 mg in patient possession  -aspirin 81 mg -atorvastatin 80 mg---last refill date on bottle 10/08/15 #90  -carvedilol 3.125 mg---on medication list and had not yet been picked up from pharmacy at time of visit  -chlorthalidone 25 mg---last refill date on bottle 03/06/16 #30 -isosorbide mononitrate 30 mg---on medication list and last refill on bottle 07/15/15 #90  -lisinopril 40 mg ---last refill on bottle 08/27/15 #90  -lisinopril 20 mg---in his possession with last refill date of 03/06/16 and directions to take 2 tabs daily.    Endocrine: -metformin 500 mg last refill on bottle 08/27/15 #45  Pain: -gabapentin 100 mg---last refill date on bottle 01/01/16 #90 -ibuprofen ---on active medication list and patient reports not taking and he does not have medication anymore  Vitamins/Minerals: -cholecalciferol---patient and Juan FastRonald report not having  Miscellaneous: -latanoprost eye drops  -triamcinolone cream---patient reports does not have   Medications to avoid in the elderly:  Amitriptyline---increased risk of falls and anti-cholinergic side effects   Other issues noted:  ---Suggest BMP to evaluate patient's potassium and renal function with diuretic and lisinopril if one has not been done recently.    Medication management:  Provided patient with a 7-day weekly pill planner.  During the visit, the pill planner was filled with patient and Juan FastRonald with the following medications: -aspirin -gabapentin -atorvastatin -lisinopril 40 mg -amlodipine 10 mg -chlorthalidone   Juan FastRonald states he will add carvedilol and metformin once he gets them from the pharmacy.    During home visit placed  a call to patient's PCP office to verify if patient is to be taking amitriptyline or isosorbide mononitrate as both medications were on his medication list.  Spoke with Juan King, CMA with Juan King and was told Juan King did not resume these meds at office visit.    Juan King placed amlodipine 5 mg, lisinopril 20 mg, amitriptyline and isosorbide mononitrate aside to reduce risk of dosing errors.    Plan:  Will route this note to patient's PCP and barriers letter to PCP.    Will place follow-up call to patient/Juan King in about 1 week to follow-up on medication adherence.   Tommye StandardKevin Tayra Dawe, PharmD, [facility]awBCACP Clinical Pharmacist [facility] [phone]

## 2016-04-17 ENCOUNTER — Encounter: Payer: Self-pay | Admitting: Pharmacist

## 2016-04-17 ENCOUNTER — Encounter: Payer: Self-pay | Admitting: *Deleted

## 2016-04-21 ENCOUNTER — Other Ambulatory Visit: Payer: Self-pay | Admitting: Pharmacist

## 2016-04-21 DIAGNOSIS — L400 Psoriasis vulgaris: Secondary | ICD-10-CM | POA: Diagnosis not present

## 2016-04-21 NOTE — Patient Outreach (Signed)
[facility]) Care Management  04/21/2016  Juan King D King 04-03-1951 [id number]  Successful phone outreach placed to Juan King, patient's friend whom patient has consented to be spoken with about patient's health needs.   Mr Juan King was able to provided verification of patient's HIPAA details.     Mr Juan King states that he picked up patient's refills from pharmacy last week following patient's PCP appointment and patient has been using the weekly pill planner to take his medications.   Mr Juan King states that a follow-up home visit to ensure he and patient are able to fill pill planner would be helpful.   Plan:  Home visit scheduled to go over pill planner filling scheduled.   Juan King, PharmD, [facility] HospitalBCACP Clinical Pharmacist [facility] [phone]

## 2016-04-22 ENCOUNTER — Other Ambulatory Visit: Payer: Self-pay | Admitting: Pharmacist

## 2016-04-22 NOTE — Patient Outreach (Signed)
[facility]) Care Management  [facility]   04/22/2016  Juan HakeLarry D Darcey 05-Nov-1950 [id number]  Subjective:  Follow-up home visit with Mr Juan King to review his adherence and filling of medication pill planner.  Patient's friend, Juan LintsRonald Poole, was also present per patient request.  Patient verified HIPAA details.   Patient and Juan King report patient has been using pill planner and that it has helped.    Patient states he missed Saturday and Sunday pills because he was out of town and forget to take medication with him.    Juan King states he picked up patient's refills last week.  Juan King states he did not add any medications yet because he wanted to make sure he added the correct medication.    Objective:   Encounter Medications: Outpatient Encounter Prescriptions as of 04/22/2016  Medication Sig Note  . amitriptyline (ELAVIL) 10 MG tablet Take 1 tablet (10 mg total) by mouth at bedtime. (Patient not taking: Reported on 04/16/2016)   . amLODipine (NORVASC) 10 MG tablet Take 1 tablet (10 mg total) by mouth daily.   Marland Kitchen. aspirin EC 81 MG tablet Take 81 mg by mouth daily. Reported on 03/06/2016 04/17/2015: [facility] Received Sig: Take by mouth.  Marland Kitchen. atorvastatin (LIPITOR) 80 MG tablet Take 1 tablet (80 mg total) by mouth daily.   . Blood Pressure Monitoring (BLOOD PRESSURE CUFF) MISC 1 each by Does not apply route daily. (Patient not taking: Reported on 04/14/2016)   . carvedilol (COREG) 3.125 MG tablet Take 1 tablet (3.125 mg total) by mouth 2 (two) times daily with a meal. (Patient not taking: Reported on 04/17/2016) 04/17/2016: Patient and friend need to get med from pharmacy.    . chlorthalidone (HYGROTON) 25 MG tablet Take 1 tablet (25 mg total) by mouth daily.   . cholecalciferol (VITAMIN D) 1000 UNITS tablet Take 1,000 Units by mouth daily. Reported on 03/06/2016   . gabapentin (NEURONTIN) 100 MG capsule Take 1 capsule 3 times a day.   .  ibuprofen (ADVIL,MOTRIN) 600 MG tablet Take 600 mg by mouth every 6 (six) hours as needed.   . isosorbide mononitrate (IMDUR) 30 MG 24 hr tablet Take 1 tablet (30 mg total) by mouth daily. (Patient not taking: Reported on 04/14/2016)   . latanoprost (XALATAN) 0.005 % ophthalmic solution Place 1 drop into both eyes 2 (two) times daily. Reported on 03/06/2016 12/16/2015: [facility] Received Sig:   . lisinopril (PRINIVIL,ZESTRIL) 40 MG tablet Take 1 tablet (40 mg total) by mouth daily.   . metFORMIN (GLUCOPHAGE) 500 MG tablet Take 1/2 tablet by mouth each morning.   . triamcinolone cream (KENALOG) 0.1 % Apply 1 application topically 2 (two) times daily. Apply twice a day until clear.    No facility-administered encounter medications on file as of 04/22/2016.     Functional Status: In your present state of health, do you have any difficulty performing the following activities: 04/14/2016 08/06/2015  Hearing? Y N  Vision? Y N  Difficulty concentrating or making decisions? Y N  Walking or climbing stairs? N Y  Dressing or bathing? N Y  Doing errands, shopping? Juan JohnsY Y  Preparing Food and eating ? Y -  Using the Toilet? N -  In the past six months, have you accidently leaked urine? N -  Do you have problems with loss of bowel control? N -  Managing your Medications? N -  Managing your Finances? Y -  Housekeeping or  managing your Housekeeping? N -  Some recent data might be hidden    Fall/Depression Screening: PHQ 2/9 Scores 04/16/2016 04/14/2016 08/06/2015 04/17/2015  PHQ - 2 Score 0 0 0 0    Assessment:  Medication adherence:  Patient missed Saturday and Sunday pills as well as the afternoon and evening dose of gabapentin every day.    Counseled patient on importance of taking medications with him when he is out of town.  Patient had not yet taken metformin or carvedilol which Dr Juanetta GoslingHawkins ordered at last office visit.      Reviewed filling of weekly pill planner  with patient and Juan King, who states he will be helping patient.    Pill planner was filled with Juan Fastonald and patient and Juan King was  needing to split metformin in half and add metformin to pill planner.    Patient's adherence seems to have improved slightly with use of pill planner based on patient and [name]'s report.    Plan:  Will place a follow-up phone call to patient/[name] [name] in about 2 weeks to discuss patient's adherence and filling of pill planner.   Juan Fastonald and patient confirmed they have [facility] phone number should they need to call sooner.   Tommye StandardKevin Chrissa Meetze, PharmD, [facility] CenterBCACP Clinical Pharmacist [facility] [phone]

## 2016-05-01 ENCOUNTER — Telehealth: Payer: Self-pay | Admitting: Family Medicine

## 2016-05-01 NOTE — Telephone Encounter (Signed)
Juan King at [facility] a [facility] for pt can see Dr. Logan King for diabetic foot care on Sept 5th.  Her call back number is [phone]

## 2016-05-04 ENCOUNTER — Ambulatory Visit: Payer: Commercial Managed Care - HMO | Admitting: Podiatry

## 2016-05-04 NOTE — Telephone Encounter (Signed)
[employer] auth # [id number] Valild 05/12/16-11/08/16 NPI [id number] DX: B35.1 and L60.2.

## 2016-05-06 ENCOUNTER — Other Ambulatory Visit: Payer: Self-pay | Admitting: Pharmacist

## 2016-05-06 NOTE — Patient Outreach (Signed)
[facility]) Care Management  05/06/2016  Girtha HakeLarry D Lanpher Sep 09, 1950 [id number]  05/06/16 1332: Called patient's friend/caregiver, [facility] Cecille Averonal Poole, to follow-up medication adherence of patient.  No answer, left HIPAA complaint voicemail requesting call back.    05/06/16 1537:  Incoming call from [name], returning voicemail.  He verified patient's HIPAA details.  [name] states that he is filling patient's pill planner each week and he reports that patient is taking his morning medications.  Windy Fastonald asked patient during the phone call if patient is taking afternoon and evening medication and patient told [name] yes, (this could be heard in the background during phone call).   [name] states that neither he nor patient have any medication questions at this time, but he states he would be interested in a follow-up home visit to ensure he is filling patient's pill planner correctly.  [name] states that he doesn't think he will be able to keep patient's upcoming appointments with [facility] or [facility] due to schedule conflicts.     Plan:  Home visit scheduled for next month.    Will in-basket, Rose, [facility] RN, and Pinckney[place]rystal, [facility] LCSW that [name] may need their appointments re-scheduled.    [facility] CM Care Plan Problem One   Flowsheet Row Most Recent Value  Care Plan Problem One  Medication adherence  Role Documenting the Problem One  Clinical Pharmacist  Care Plan for Problem One  Active  [facility] CM Short Term Goal #1 (0-30 days)  Patient will take medications as prescriibed by his PCP over the next 30 days   [facility] CM Short Term Goal #1 Start Date  04/16/16  Interventions for Short Term Goal #1  encouraged patient/caregiver to continue using pill planner to help patient remember his daily medications       Tommye StandardKevin Lataunya Ruud, PharmD, [facility] SystemBCACP Clinical Pharmacist [facility] [phone]

## 2016-05-07 ENCOUNTER — Telehealth: Payer: Self-pay | Admitting: *Deleted

## 2016-05-07 ENCOUNTER — Other Ambulatory Visit: Payer: Self-pay | Admitting: *Deleted

## 2016-05-07 NOTE — Telephone Encounter (Signed)
Entered in error

## 2016-05-07 NOTE — Patient Outreach (Signed)
Follow up phone call - received an in basket from [facility] pharmacist that caregiver Juan LintsRonald King wanted to reschedule pt's home visit with RN CM.   Spoke with Juan Lintsonald King (on consent form), HIPAA verified on pt.    Home visit cancelled for tomorrow, rescheduled to take place within  next 3 weeks.    [name]   Tanaja Ganger RN CCM [facility]  [phone]

## 2016-05-08 ENCOUNTER — Ambulatory Visit: Payer: Self-pay | Admitting: *Deleted

## 2016-05-08 ENCOUNTER — Other Ambulatory Visit: Payer: Self-pay | Admitting: *Deleted

## 2016-05-08 NOTE — Patient Outreach (Signed)
[facility]) Care Management  05/08/2016  Girtha HakeLarry D Insley 01-Oct-1950 [id number]    Phone call to patient's caregiver to re-schedule home visited.  Message received from Pharmacist Tommye StandardKevin Ruedinger  that patient's caregiver would like to re-schedule home visit.  HIPPA compliant voicemail message left for a return call.   Adriana ReamsChrystal Tykwon Fera, LCSW [facility] Care Management [phone]

## 2016-05-12 ENCOUNTER — Encounter: Payer: Self-pay | Admitting: Podiatry

## 2016-05-12 ENCOUNTER — Ambulatory Visit (INDEPENDENT_AMBULATORY_CARE_PROVIDER_SITE_OTHER): Payer: Commercial Managed Care - HMO | Admitting: Podiatry

## 2016-05-12 DIAGNOSIS — M79674 Pain in right toe(s): Secondary | ICD-10-CM | POA: Diagnosis not present

## 2016-05-12 DIAGNOSIS — M79675 Pain in left toe(s): Secondary | ICD-10-CM

## 2016-05-12 DIAGNOSIS — B351 Tinea unguium: Secondary | ICD-10-CM | POA: Diagnosis not present

## 2016-05-12 DIAGNOSIS — L609 Nail disorder, unspecified: Secondary | ICD-10-CM

## 2016-05-12 DIAGNOSIS — L608 Other nail disorders: Secondary | ICD-10-CM

## 2016-05-12 DIAGNOSIS — L603 Nail dystrophy: Secondary | ICD-10-CM

## 2016-05-12 NOTE — Progress Notes (Signed)
SUBJECTIVE Patient with a history of diabetes mellitus presents to office today complaining of elongated, thickened nails. Pain while ambulating in shoes. Patient is unable to trim their own nails. Patient also states she has a history of psoriasis which is present with psoriatic plaques on his arms and ankles  No Known Allergies  OBJECTIVE General Patient is awake, alert, and oriented x 3 and in no acute distress. Derm Skin is dry and supple bilateral. Negative open lesions or macerations. Remaining integument unremarkable. Nails are tender, long, thickened and dystrophic with subungual debris, consistent with onychomycosis, 1-5 bilateral. No signs of infection noted. Vasc  DP and PT pedal pulses palpable bilaterally. Temperature gradient within normal limits.  Neuro Epicritic and protective threshold sensation diminished bilaterally.  Musculoskeletal Exam No symptomatic pedal deformities noted bilateral. Muscular strength within normal limits.  ASSESSMENT 1. Diabetes Mellitus w/ peripheral neuropathy 2. Onychomycosis of nail due to dermatophyte bilateral 3. Pain in foot bilateral  PLAN OF CARE Patient evaluated today. Instructed to maintain good pedal hygiene and foot care. Stressed importance of controlling blood sugar.  Mechanical debridement of nails 1-5 bilaterally performed using a nail nipper. Filed with dremel without incident.  All patient questions were answered. Return to clinic in 3 mos.    Felecia ShellingBrent M Evans, DPM

## 2016-05-14 ENCOUNTER — Ambulatory Visit (INDEPENDENT_AMBULATORY_CARE_PROVIDER_SITE_OTHER): Payer: Commercial Managed Care - HMO | Admitting: Family Medicine

## 2016-05-14 ENCOUNTER — Encounter: Payer: Self-pay | Admitting: Family Medicine

## 2016-05-14 VITALS — BP 175/85 | HR 66 | Temp 98.6°F | Resp 16 | Ht 60.0 in | Wt 134.4 lb

## 2016-05-14 DIAGNOSIS — I10 Essential (primary) hypertension: Secondary | ICD-10-CM | POA: Diagnosis not present

## 2016-05-14 DIAGNOSIS — R079 Chest pain, unspecified: Secondary | ICD-10-CM

## 2016-05-14 DIAGNOSIS — R6889 Other general symptoms and signs: Secondary | ICD-10-CM | POA: Diagnosis not present

## 2016-05-14 DIAGNOSIS — I25110 Atherosclerotic heart disease of native coronary artery with unstable angina pectoris: Secondary | ICD-10-CM | POA: Diagnosis not present

## 2016-05-14 MED ORDER — HYDRALAZINE HCL 25 MG PO TABS: 25.0000 mg | ORAL_TABLET | Freq: Three times a day (TID) | ORAL | 6 refills | Status: DC

## 2016-05-14 NOTE — Patient Instructions (Signed)
Go to ER at [facility] if any recurrence of chest pain, especially if prolonged.

## 2016-05-14 NOTE — Progress Notes (Signed)
Name: Juan King   MRN: [id number]    DOB: 03-Oct-1950   Date:05/14/2016       Progress Note  Subjective  Chief Complaint  Chief Complaint  Patient presents with  . Hypertension    Follow up  . Chest Pain    onset just started     HPI  Here for f/u of HBP.  He is taking all meds.  He reports an episode of brief chest pain this AM while he was doing some mild housework.   It resolved quickly with brief rest and has not recurred  No problem-specific Assessment & Plan notes found for this encounter.   Past Medical History:  Diagnosis Date  . Allergic rhinitis   . Coronary arteriosclerosis   . Diabetes mellitus type 2, controlled (HCC)   . Hyperlipidemia   . Hypertension   . Neuropathy of both feet (HCC)   . Personal history of fall   . Primary hypertension   . Psoriasis   . Screening for depression   . Seasonal allergies     Past Surgical History:  Procedure Laterality Date  . CORONARY STENT PLACEMENT      Family History  Problem Relation Age of Onset  . Diabetes Mother   . Hypertension Mother   . Diabetes Father   . Vision loss Father   . Hypertension Father   . Diabetes Sister     Social History   Social History  . Marital status: Married    Spouse name: N/A  . Number of children: N/A  . Years of education: N/A   Occupational History  . Not on file.   Social History Main Topics  . Smoking status: Never Smoker  . Smokeless tobacco: Never Used  . Alcohol use No  . Drug use: No  . Sexual activity: Not on file   Other Topics Concern  . Not on file   Social History Narrative  . No narrative on file     Current Outpatient Prescriptions:  .  amitriptyline (ELAVIL) 10 MG tablet, Take 1 tablet (10 mg total) by mouth at bedtime., Disp: 90 tablet, Rfl: 3 .  amLODipine (NORVASC) 10 MG tablet, Take 1 tablet (10 mg total) by mouth daily., Disp: 30 tablet, Rfl: 6 .  aspirin EC 81 MG tablet, Take 81 mg by mouth daily. Reported on 03/06/2016, Disp: ,  Rfl:  .  atorvastatin (LIPITOR) 80 MG tablet, Take 1 tablet (80 mg total) by mouth daily., Disp: 90 tablet, Rfl: 3 .  Blood Pressure Monitoring (BLOOD PRESSURE CUFF) MISC, 1 each by Does not apply route daily., Disp: 1 each, Rfl: 0 .  carvedilol (COREG) 3.125 MG tablet, Take 1 tablet (3.125 mg total) by mouth 2 (two) times daily with a meal., Disp: 60 tablet, Rfl: 6 .  chlorthalidone (HYGROTON) 25 MG tablet, Take 1 tablet (25 mg total) by mouth daily., Disp: 30 tablet, Rfl: 6 .  cholecalciferol (VITAMIN D) 1000 UNITS tablet, Take 1,000 Units by mouth daily. Reported on 03/06/2016, Disp: , Rfl:  .  gabapentin (NEURONTIN) 100 MG capsule, Take 1 capsule 3 times a day., Disp: 90 capsule, Rfl: 3 .  ibuprofen (ADVIL,MOTRIN) 600 MG tablet, Take 600 mg by mouth every 6 (six) hours as needed., Disp: , Rfl:  .  isosorbide mononitrate (IMDUR) 30 MG 24 hr tablet, Take 1 tablet (30 mg total) by mouth daily., Disp: 90 tablet, Rfl: 3 .  latanoprost (XALATAN) 0.005 % ophthalmic solution, Place 1 drop into both  eyes 2 (two) times daily. Reported on 03/06/2016, Disp: , Rfl:  .  lisinopril (PRINIVIL,ZESTRIL) 40 MG tablet, Take 1 tablet (40 mg total) by mouth daily., Disp: 90 tablet, Rfl: 3 .  metFORMIN (GLUCOPHAGE) 500 MG tablet, Take 1/2 tablet by mouth each morning., Disp: 45 tablet, Rfl: 3 .  triamcinolone cream (KENALOG) 0.1 %, Apply 1 application topically 2 (two) times daily. Apply twice a day until clear., Disp: , Rfl:  .  hydrALAZINE (APRESOLINE) 25 MG tablet, Take 1 tablet (25 mg total) by mouth 3 (three) times daily., Disp: 90 tablet, Rfl: 6  Not on File   Review of Systems  Constitutional: Negative for chills, fever, malaise/fatigue and weight loss.  HENT: Negative for hearing loss.   Eyes: Negative for blurred vision and double vision.  Respiratory: Negative for cough, shortness of breath and wheezing.   Cardiovascular: Positive for chest pain (brief and resolved spontaneously.). Negative for  palpitations and leg swelling.  Gastrointestinal: Negative for abdominal pain, blood in stool and heartburn.  Genitourinary: Negative for dysuria, frequency and urgency.  Skin: Negative for rash.  Neurological: Positive for dizziness (brief and resolved spontaneously). Negative for tremors, weakness and headaches.      Objective  Vitals:   05/14/16 1022 05/14/16 1029 05/14/16 1133 05/14/16 1135  BP: (!) 193/94 (!) 174/83 (!) 175/85   Pulse: (!) 59 62 72 66  Resp: 16     Temp: 98.6 F (37 C)     TempSrc: Oral     Weight: 134 lb 6.4 oz (61 kg)     Height: 5' (1.524 m)       Physical Exam  Constitutional: He is oriented to person, place, and time and well-developed, well-nourished, and in no distress. No distress.  HENT:  Head: Normocephalic and atraumatic.  Eyes: Conjunctivae and EOM are normal. Pupils are equal, round, and reactive to light. No scleral icterus.  Neck: Normal range of motion. Neck supple. Carotid bruit is not present. No thyromegaly present.  Cardiovascular: Normal rate, regular rhythm and normal heart sounds.  Exam reveals no gallop and no friction rub.   No murmur heard. EKG with nonspecific lateral T wave changes and sinus bradycardia.  Pulmonary/Chest: Effort normal and breath sounds normal. No respiratory distress. He has no wheezes. He has no rales.  Musculoskeletal: He exhibits no edema.  Lymphadenopathy:    He has no cervical adenopathy.  Neurological: He is alert and oriented to person, place, and time.  Vitals reviewed.      Recent Results (from the past 2160 hour(s))  POCT HgB A1C     Status: Normal   Collection Time: 03/06/16 10:25 AM  Result Value Ref Range   Hemoglobin A1C 6.3      Assessment & Plan  Problem List Items Addressed This Visit      Cardiovascular and Mediastinum   BP (high blood pressure) - Primary   Relevant Medications   hydrALAZINE (APRESOLINE) 25 MG tablet   Atherosclerosis of coronary artery   Relevant  Medications   hydrALAZINE (APRESOLINE) 25 MG tablet     Other   Chest pain   Relevant Orders   EKG 12-Lead    Other Visit Diagnoses   None.     Meds ordered this encounter  Medications  . hydrALAZINE (APRESOLINE) 25 MG tablet    Sig: Take 1 tablet (25 mg total) by mouth 3 (three) times daily.    Dispense:  90 tablet    Refill:  6   1.  Chest pain, unspecified chest pain type  - EKG 12-Lead Go to ER if pains continue or recur. 2. Essential hypertension Cont other meds - hydrALAZINE (APRESOLINE) 25 MG tablet; Take 1 tablet (25 mg total) by mouth 3 (three) times daily.  Dispense: 90 tablet; Refill: 6  3. 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

## 2016-05-15 ENCOUNTER — Ambulatory Visit: Payer: Self-pay | Admitting: *Deleted

## 2016-05-20 ENCOUNTER — Telehealth: Payer: Self-pay | Admitting: Family Medicine

## 2016-05-20 NOTE — Telephone Encounter (Signed)
Pt needs a [employer] referral to see Dr. Brooke [facility]areKing at [facility] on the Sept 19th.  Mr. Juan King's contact number is [phone]

## 2016-05-21 NOTE — Telephone Encounter (Incomplete)
Approved 05/21/16 - 11/17/16  Authorization number is [id number]

## 2016-05-25 ENCOUNTER — Ambulatory Visit: Payer: Self-pay | Admitting: *Deleted

## 2016-05-25 ENCOUNTER — Other Ambulatory Visit: Payer: Self-pay | Admitting: Pharmacist

## 2016-05-25 ENCOUNTER — Encounter: Payer: Self-pay | Admitting: Pharmacist

## 2016-05-25 NOTE — Patient Outreach (Signed)
[facility]) Care Management  05/25/2016  Juan King 12-27-1950 [id number]  Phone call to [name], patient's friend/caregiver on [facility] Consent form who verified HIPAA details of patient.   [name] reports patient has been taking his medications and that he is filling patient's pill planner.  [name] requests a home visit to review patient's pill planner.   Plan:  Home visit scheduled to review pill planner.   Tommye StandardKevin Daison Braxton, PharmD, [facility]ACP Clinical Pharmacist [facility] [phone]

## 2016-05-26 ENCOUNTER — Other Ambulatory Visit: Payer: Self-pay | Admitting: Pharmacist

## 2016-05-26 DIAGNOSIS — H40003 Preglaucoma, unspecified, bilateral: Secondary | ICD-10-CM | POA: Diagnosis not present

## 2016-05-28 ENCOUNTER — Other Ambulatory Visit: Payer: Self-pay | Admitting: *Deleted

## 2016-05-28 ENCOUNTER — Encounter: Payer: Self-pay | Admitting: Pharmacist

## 2016-05-28 NOTE — Patient Outreach (Signed)
[facility]) Care Management   05/28/2016  Juan King 03-Jan-1951 [id number]  Juan King is an 65 y.o. male  Subjective:  Pt reports has been checking his BP, still up, cannot find his BP machine to show  Results (BP machine old, wrist type).  Lucio Edward reports pt suppose to start on Norvasc, just Picked up medication from pharmacy  today, to place in pt's pill  planner for him to start tomorrow.  Pt reports saw Eye MD recently for routine check up, got new glasses.      Objective:   Vitals:   05/28/16 1701 05/28/16 1705  BP: (!) 172/100 (!) 170/90  Pulse:    Resp:      ROS  Physical Exam  Constitutional: He is oriented to person, place, and time. He appears well-developed and well-nourished.  Cardiovascular: Normal rate and regular rhythm.   Respiratory: Effort normal and breath sounds normal.  GI: Soft. Bowel sounds are normal.  Musculoskeletal: Normal range of motion. He exhibits no edema.  Neurological: He is alert and oriented to person, place, and time.  Skin: Skin is warm and dry.  Psychiatric: He has a normal mood and affect. His behavior is normal.    Encounter Medications:  Reviewed with pt and friend Jori Moll  Outpatient Encounter Prescriptions as of 05/28/2016  Medication Sig Note  . amLODipine (NORVASC) 10 MG tablet Take 1 tablet (10 mg total) by mouth daily.   Marland Kitchen aspirin EC 81 MG tablet Take 81 mg by mouth daily. Reported on 03/06/2016 04/17/2015: Received from: [place]: Take by mouth.  Marland Kitchen atorvastatin (LIPITOR) 80 MG tablet Take 1 tablet (80 mg total) by mouth daily.   . Blood Pressure Monitoring (BLOOD PRESSURE CUFF) MISC 1 each by Does not apply route daily.   . carvedilol (COREG) 3.125 MG tablet Take 1 tablet (3.125 mg total) by mouth 2 (two) times daily with a meal. 04/17/2016: Patient and friend need to get med from pharmacy.    . chlorthalidone (HYGROTON) 25 MG tablet Take 1 tablet (25 mg total)  by mouth daily.   Marland Kitchen gabapentin (NEURONTIN) 100 MG capsule Take 1 capsule 3 times a day.   . hydrALAZINE (APRESOLINE) 25 MG tablet Take 1 tablet (25 mg total) by mouth 3 (three) times daily.   Marland Kitchen latanoprost (XALATAN) 0.005 % ophthalmic solution 1 drop at bedtime.   Marland Kitchen lisinopril (PRINIVIL,ZESTRIL) 40 MG tablet Take 1 tablet (40 mg total) by mouth daily.   . metFORMIN (GLUCOPHAGE) 500 MG tablet Take 1/2 tablet by mouth each morning.   . timolol (BETIMOL) 0.5 % ophthalmic solution Place 1 drop into both eyes daily.   Marland Kitchen triamcinolone cream (KENALOG) 0.1 % Apply 1 application topically 2 (two) times daily. Apply twice a day until clear.   Marland Kitchen amitriptyline (ELAVIL) 10 MG tablet Take 1 tablet (10 mg total) by mouth at bedtime. (Patient not taking: Reported on 05/28/2016)   . cholecalciferol (VITAMIN D) 1000 UNITS tablet Take 1,000 Units by mouth daily. Reported on 03/06/2016   . ibuprofen (ADVIL,MOTRIN) 600 MG tablet Take 600 mg by mouth every 6 (six) hours as needed.   . isosorbide mononitrate (IMDUR) 30 MG 24 hr tablet Take 1 tablet (30 mg total) by mouth daily. (Patient not taking: Reported on 05/28/2016)   . latanoprost (XALATAN) 0.005 % ophthalmic solution Place 1 drop into both eyes 2 (two) times daily. Reported on 03/06/2016 05/28/2016: Repeat    No facility-administered encounter medications  on file as of 05/28/2016.     Functional Status:   In your present state of health, do you have any difficulty performing the following activities: 04/14/2016 08/06/2015  Hearing? Y N  Vision? Y N  Difficulty concentrating or making decisions? Y N  Walking or climbing stairs? N Y  Dressing or bathing? N Y  Doing errands, shopping? [name] Donning  Preparing Food and eating ? Y -  Using the Toilet? N -  In the past six months, have you accidently leaked urine? N -  Do you have problems with loss of bowel control? N -  Managing your Medications? N -  Managing your Finances? Y -  Housekeeping or managing your  Housekeeping? N -  Some recent data might be hidden    Fall/Depression Screening:    PHQ 2/9 Scores 05/28/2016 04/16/2016 04/14/2016 08/06/2015 04/17/2015  PHQ - 2 Score 0 0 0 0 0    Assessment:  Pleasant 1 year old gentleman, staying with family friend Jori Moll, manages his pill  Insurance account manager.  Pt continues to [facility]- Friday, exercise program provided.  Hypertension-  RN CM checked pt's BP in right arm- result 180/100.  Checked in left arm 172/100,  Recheck 172/90.  Pt/ friend given information on Low Na+ diet- reviewed Low Na+ foods, foods to  Avoid, reading food labels for sodium content.    Plan:  Pt to start Norvasc tomorrow, take as ordered as well as other BP medications.             As discussed, friend to purchase new BP machine for pt, pt to check daily at different               Times, record, bring results to upcoming MD appointment,call MD if BP continues                To be elevated.             Plan to continue to provide pt with community nurse case management services, follow                Up again next month- home visit.   [facility] CM Care Plan Problem One   Flowsheet Row Most Recent Value  Care Plan Problem One  Hypertension- uncontrolled   Role Documenting the Problem One  Care Management Coordinator  Care Plan for Problem One  Active  [facility] Long Term Goal (31-90 days)  Pt's BP would be within normal readings within 60 days   [facility] Long Term Goal Start Date  05/28/16  Interventions for Problem One Long Term Goal  Reviewed with pt/pt's friend importance of adherence with diet, exercise, medication in controlling BP   [facility] CM Short Term Goal #1 (0-30 days)  Pt will purchase new BP machine within next 30 days  [facility] CM Short Term Goal #1 Start Date  05/28/16 Donivan Scull established. ]  Interventions for Short Term Goal #1  Reinforced need for pt to start checking BP daily, record, bring results to MD appointment, call MD if continue to be elevated.   [facility] CM Short Term  Goal #2 (0-30 days)  Pt's knowledge of Low Na+ diet will increase in the next 30 days   [facility] CM Short Term Goal #2 Start Date  05/28/16 [not met in time frame- reestablished ]  Interventions for Short Term Goal #2  Re established- information on Low Na+ diet provided again, reviewed foods to avoid/allowed, reading foods  labels for Na+ content.      Zara Chess.   [place] Care Management  [phone]

## 2016-05-28 NOTE — Progress Notes (Signed)
Meds appear correct at this time.-jh

## 2016-05-28 NOTE — Patient Outreach (Signed)
[facility]) Care Management  [facility] Social Work  05/28/2016  Juan King 1951-07-22 [id number]  Subjective:  Patient is a 65 year old male, currently residing with family friend.  Objective:   Encounter Medications:  Outpatient Encounter Prescriptions as of 05/28/2016  Medication Sig Note  . latanoprost (XALATAN) 0.005 % ophthalmic solution 1 drop at bedtime.   Marland Kitchen amitriptyline (ELAVIL) 10 MG tablet Take 1 tablet (10 mg total) by mouth at bedtime. (Patient not taking: Reported on 05/28/2016)   . amLODipine (NORVASC) 10 MG tablet Take 1 tablet (10 mg total) by mouth daily.   Marland Kitchen aspirin EC 81 MG tablet Take 81 mg by mouth daily. Reported on 03/06/2016 04/17/2015: Received from: [facility] Received Sig: Take by mouth.  Marland Kitchen atorvastatin (LIPITOR) 80 MG tablet Take 1 tablet (80 mg total) by mouth daily.   . Blood Pressure Monitoring (BLOOD PRESSURE CUFF) MISC 1 each by Does not apply route daily.   . carvedilol (COREG) 3.125 MG tablet Take 1 tablet (3.125 mg total) by mouth 2 (two) times daily with a meal. 04/17/2016: Patient and friend need to get med from pharmacy.    . chlorthalidone (HYGROTON) 25 MG tablet Take 1 tablet (25 mg total) by mouth daily.   . cholecalciferol (VITAMIN D) 1000 UNITS tablet Take 1,000 Units by mouth daily. Reported on 03/06/2016   . gabapentin (NEURONTIN) 100 MG capsule Take 1 capsule 3 times a day.   . hydrALAZINE (APRESOLINE) 25 MG tablet Take 1 tablet (25 mg total) by mouth 3 (three) times daily.   Marland Kitchen ibuprofen (ADVIL,MOTRIN) 600 MG tablet Take 600 mg by mouth every 6 (six) hours as needed.   . isosorbide mononitrate (IMDUR) 30 MG 24 hr tablet Take 1 tablet (30 mg total) by mouth daily. (Patient not taking: Reported on 05/28/2016)   . latanoprost (XALATAN) 0.005 % ophthalmic solution Place 1 drop into both eyes 2 (two) times daily. Reported on 03/06/2016 12/16/2015: Received from: [facility] Received Sig:   .  lisinopril (PRINIVIL,ZESTRIL) 40 MG tablet Take 1 tablet (40 mg total) by mouth daily.   . metFORMIN (GLUCOPHAGE) 500 MG tablet Take 1/2 tablet by mouth each morning.   . triamcinolone cream (KENALOG) 0.1 % Apply 1 application topically 2 (two) times daily. Apply twice a day until clear.   . [DISCONTINUED] timolol (TIMOPTIC-XR) 0.5 % ophthalmic gel-forming 1 drop daily.    No facility-administered encounter medications on file as of 05/28/2016.     Functional Status:  In your present state of health, do you have any difficulty performing the following activities: 04/14/2016 08/06/2015  Hearing? Y N  Vision? Y N  Difficulty concentrating or making decisions? Y N  Walking or climbing stairs? N Y  Dressing or bathing? N Y  Doing errands, shopping? [name]  Preparing Food and eating ? Y -  Using the Toilet? N -  In the past six months, have you accidently leaked urine? N -  Do you have problems with loss of bowel control? N -  Managing your Medications? N -  Managing your Finances? Y -  Housekeeping or managing your Housekeeping? N -  Some recent data might be hidden    Fall/Depression Screening:  PHQ 2/9 Scores 05/28/2016 04/16/2016 04/14/2016 08/06/2015 04/17/2015  PHQ - 2 Score 0 0 0 0 0    Assessment: Patient reports doing well, continues to reside with caregiver Juan King. Patient continues to be active with the [employer], attending  daily from 8:30-1:00pm.  [place] by [employer]. Current caregiver, Juan LintsRonald King provides meals for patient, manages his medications and is his payee.  Patient's Advanced Directive completed and has been notarized.  Copy obtained and will be scanned in. Juan LintsRonald King named as patient's Health Care Power of MartinAttorney.  Patient receives $16.00 in food stamps and receives full extra help. Juan King. Contact phone number provider to patient's social worker through the Department of Social Services-Juan King to contact with additional  questions regarding patient's medicaid eligibility. [phone]. Patient and caregiver verbalized having no other community resource needs.  Patient to be closed to social work at this time.  Plan: Patient's provider and RNCM to be notified of case closure.   Juan ReamsChrystal Land, LCSW [facility] Care Management [phone]

## 2016-05-28 NOTE — Progress Notes (Signed)
Please send a prescription to his pharmacy for [employer]  Air Inhaler, 2 puffs up to 4 times a day for SOB.  #1 inhaler /12 refills.

## 2016-05-28 NOTE — Patient Outreach (Signed)
[facility]) Care Management  [facility] CM Pharmacy   05/28/2016  Juan King 1951-08-12 [id number]  Late entry for 05/26/16 home visit.    Subjective:  Follow-up home visit with patient, and his friend/caregiver [name], whom patient requests be present.    [name] states he fills patient's pill planner.  Patient states he takes his am medications with him to senior center and then takes his afternoon and evening medications at place of residence.     Patient states that he has an albuterol inhaler that he uses when he gets short of breath.  Objective:   Encounter Medications: Outpatient Encounter Prescriptions as of 05/26/2016  Medication Sig Note  . amLODipine (NORVASC) 10 MG tablet Take 1 tablet (10 mg total) by mouth daily.   Marland Kitchen aspirin EC 81 MG tablet Take 81 mg by mouth daily. Reported on 03/06/2016 04/17/2015: Received from: [facility] Received Sig: Take by mouth.  Marland Kitchen atorvastatin (LIPITOR) 80 MG tablet Take 1 tablet (80 mg total) by mouth daily.   . carvedilol (COREG) 3.125 MG tablet Take 1 tablet (3.125 mg total) by mouth 2 (two) times daily with a meal. 04/17/2016: Patient and friend need to get med from pharmacy.    . chlorthalidone (HYGROTON) 25 MG tablet Take 1 tablet (25 mg total) by mouth daily.   Marland Kitchen gabapentin (NEURONTIN) 100 MG capsule Take 1 capsule 3 times a day.   . hydrALAZINE (APRESOLINE) 25 MG tablet Take 1 tablet (25 mg total) by mouth 3 (three) times daily.   Marland Kitchen latanoprost (XALATAN) 0.005 % ophthalmic solution Place 1 drop into both eyes 2 (two) times daily. Reported on 03/06/2016 12/16/2015: Received from: [facility] Received Sig:   . lisinopril (PRINIVIL,ZESTRIL) 40 MG tablet Take 1 tablet (40 mg total) by mouth daily.   . metFORMIN (GLUCOPHAGE) 500 MG tablet Take 1/2 tablet by mouth each morning.   . triamcinolone cream (KENALOG) 0.1 % Apply 1 application topically 2 (two) times daily. Apply twice a day  until clear.   Marland Kitchen amitriptyline (ELAVIL) 10 MG tablet Take 1 tablet (10 mg total) by mouth at bedtime. (Patient not taking: Reported on 05/28/2016)   . Blood Pressure Monitoring (BLOOD PRESSURE CUFF) MISC 1 each by Does not apply route daily.   . cholecalciferol (VITAMIN D) 1000 UNITS tablet Take 1,000 Units by mouth daily. Reported on 03/06/2016   . ibuprofen (ADVIL,MOTRIN) 600 MG tablet Take 600 mg by mouth every 6 (six) hours as needed.   . isosorbide mononitrate (IMDUR) 30 MG 24 hr tablet Take 1 tablet (30 mg total) by mouth daily. (Patient not taking: Reported on 05/28/2016)    No facility-administered encounter medications on file as of 05/26/2016.     Functional Status: In your present state of health, do you have any difficulty performing the following activities: 04/14/2016 08/06/2015  Hearing? Y N  Vision? Y N  Difficulty concentrating or making decisions? Y N  Walking or climbing stairs? N Y  Dressing or bathing? N Y  Doing errands, shopping? [name]  Preparing Food and eating ? Y -  Using the Toilet? N -  In the past six months, have you accidently leaked urine? N -  Do you have problems with loss of bowel control? N -  Managing your Medications? N -  Managing your Finances? Y -  Housekeeping or managing your Housekeeping? N -  Some recent data might be hidden    Fall/Depression Screening: PHQ 2/9 Scores  04/16/2016 04/14/2016 08/06/2015 04/17/2015  PHQ - 2 Score 0 0 0 0    Assessment:  Medication adherence:    Patient's pill box was filled but missing metformin and hydralazine---patient reports he takes these out of the bottles. He has his medications in his possession during this home visit.    Amlodipine needed a refill, and [name] ordered patient a refill during home visit.   Patient and [name] agreed that pill planner would have patient's daily, oral medications, in it.   Pill planner presently has the following medications:    Amlodipine Aspirin Atorvastatin Carvedilol twice daily Chlorthalidone Gabapentin three times daily  Hydralazine three times daily  Lisinopril 40 mg Metformin  Medication management:  Patient's albuterol inhaler was filled 08/2014 and expired 03/2016 per date on inhaler canister---medication is not on his active medication list---counseled patient on importance of taking medications as directed by prescriber and using in date medications.    Plan:  Will contact PCP regarding albuterol inhaler---if patient is to use medication recommend a new prescription be issued.  Will update PCP on patient's adherence.  Follow-up home visit scheduled within the next 2 weeks.    Tommye StandardKevin Rashelle Ireland, PharmD, [facility]ACP Clinical Pharmacist [facility] [phone]

## 2016-05-29 ENCOUNTER — Encounter: Payer: Self-pay | Admitting: *Deleted

## 2016-05-29 ENCOUNTER — Other Ambulatory Visit: Payer: Self-pay | Admitting: *Deleted

## 2016-05-29 MED ORDER — ALBUTEROL SULFATE HFA 108 (90 BASE) MCG/ACT IN AERS: 2.0000 | INHALATION_SPRAY | Freq: Four times a day (QID) | RESPIRATORY_TRACT | 12 refills | Status: AC | PRN

## 2016-05-29 NOTE — Progress Notes (Signed)
done

## 2016-06-08 ENCOUNTER — Other Ambulatory Visit: Payer: Self-pay | Admitting: Pharmacist

## 2016-06-08 NOTE — Patient Outreach (Signed)
[facility]) Care Management  [facility]   06/08/2016  Juan King 1951/04/24 [id number]  Subjective:  Follow-up home visit with patient and friend/caregiver on 06/08/16 to follow-up on patient's medication adherence.  Patient verified HIPAA details.    Patient reports he continues to take his morning medications with him to senior center and takes his afternoon as well as evening medications at place of residence.   Patient's friend/caregiver, was present during visit per patient consent, and denies difficulty filling patient's weekly pill planner---he now has two weekly pill planners for patient.    Objective:   Encounter Medications: Outpatient Encounter Prescriptions as of 06/08/2016  Medication Sig Note  . albuterol (PROVENTIL HFA;VENTOLIN HFA) 108 (90 Base) MCG/ACT inhaler Inhale 2 puffs into the lungs every 6 (six) hours as needed for wheezing or shortness of breath.   Marland Kitchen. amitriptyline (ELAVIL) 10 MG tablet Take 1 tablet (10 mg total) by mouth at bedtime. (Patient not taking: Reported on 05/28/2016)   . amLODipine (NORVASC) 10 MG tablet Take 1 tablet (10 mg total) by mouth daily.   Marland Kitchen. aspirin EC 81 MG tablet Take 81 mg by mouth daily. Reported on 03/06/2016 04/17/2015: [facility] Received Sig: Take by mouth.  Marland Kitchen. atorvastatin (LIPITOR) 80 MG tablet Take 1 tablet (80 mg total) by mouth daily.   . Blood Pressure Monitoring (BLOOD PRESSURE CUFF) MISC 1 each by Does not apply route daily.   . carvedilol (COREG) 3.125 MG tablet Take 1 tablet (3.125 mg total) by mouth 2 (two) times daily with a meal. 04/17/2016: Patient and friend need to get med from pharmacy.    . chlorthalidone (HYGROTON) 25 MG tablet Take 1 tablet (25 mg total) by mouth daily.   . cholecalciferol (VITAMIN D) 1000 UNITS tablet Take 1,000 Units by mouth daily. Reported on 03/06/2016   . gabapentin (NEURONTIN) 100 MG capsule Take 1 capsule 3 times a day.   .  hydrALAZINE (APRESOLINE) 25 MG tablet Take 1 tablet (25 mg total) by mouth 3 (three) times daily.   Marland Kitchen. ibuprofen (ADVIL,MOTRIN) 600 MG tablet Take 600 mg by mouth every 6 (six) hours as needed.   . isosorbide mononitrate (IMDUR) 30 MG 24 hr tablet Take 1 tablet (30 mg total) by mouth daily. (Patient not taking: Reported on 05/28/2016)   . latanoprost (XALATAN) 0.005 % ophthalmic solution Place 1 drop into both eyes 2 (two) times daily. Reported on 03/06/2016 05/28/2016: Repeat   . latanoprost (XALATAN) 0.005 % ophthalmic solution 1 drop at bedtime.   Marland Kitchen. lisinopril (PRINIVIL,ZESTRIL) 40 MG tablet Take 1 tablet (40 mg total) by mouth daily.   . metFORMIN (GLUCOPHAGE) 500 MG tablet Take 1/2 tablet by mouth each morning.   . timolol (BETIMOL) 0.5 % ophthalmic solution Place 1 drop into both eyes daily.   Marland Kitchen. triamcinolone cream (KENALOG) 0.1 % Apply 1 application topically 2 (two) times daily. Apply twice a day until clear.    No facility-administered encounter medications on file as of 06/08/2016.     Functional Status: In your present state of health, do you have any difficulty performing the following activities: 04/14/2016 08/06/2015  Hearing? Y N  Vision? Y N  Difficulty concentrating or making decisions? Y N  Walking or climbing stairs? N Y  Dressing or bathing? N Y  Doing errands, shopping? Malvin JohnsY Y  Preparing Food and eating ? Y -  Using the Toilet? N -  In the past six months, have you accidently  leaked urine? N -  Do you have problems with loss of bowel control? N -  Managing your Medications? N -  Managing your Finances? Y -  Housekeeping or managing your Housekeeping? N -  Some recent data might be hidden    Fall/Depression Screening: PHQ 2/9 Scores 05/28/2016 04/16/2016 04/14/2016 08/06/2015 04/17/2015  PHQ - 2 Score 0 0 0 0 0    Assessment:  Medication adherence:    Patient's pill planner is filled for the am medications.  It needed to have afternoon and evening medications added into  some days, and patient's friend added them.    Patient's adherence seems to have improved with use of pill planner.   Medication management:  Per review of this chart, Dr Juanetta Gosling, had issued a prescription for albuterol inhaler for patient---patient had not picked up.  Placed call to [facility] and was told albuterol inhaler was ready for pick up with co-pay of $8.25.    [name] asked about gabapentin refill, and [facility] reported having it ready as well.    Other:  Patient reported sometimes he gets dizzy when standing up. He was advised to discuss this with his PCP (per review of PCP note from 06/09/16 patient did mention to his PCP).    Plan:  Patient and friend/caregiver agree to pick up his refills from local pharmacy.   Follow-up home visit scheduled for 4 weeks.  [name], PharmD, [facility] Clinical Pharmacist [facility] [phone]

## 2016-06-09 ENCOUNTER — Ambulatory Visit (INDEPENDENT_AMBULATORY_CARE_PROVIDER_SITE_OTHER): Payer: Commercial Managed Care - HMO | Admitting: Family Medicine

## 2016-06-09 ENCOUNTER — Encounter: Payer: Self-pay | Admitting: Family Medicine

## 2016-06-09 VITALS — BP 180/100 | HR 62 | Temp 98.2°F | Resp 16 | Ht 60.0 in | Wt 133.0 lb

## 2016-06-09 DIAGNOSIS — L409 Psoriasis, unspecified: Secondary | ICD-10-CM | POA: Diagnosis not present

## 2016-06-09 DIAGNOSIS — I10 Essential (primary) hypertension: Secondary | ICD-10-CM

## 2016-06-09 DIAGNOSIS — E119 Type 2 diabetes mellitus without complications: Secondary | ICD-10-CM

## 2016-06-09 DIAGNOSIS — Z23 Encounter for immunization: Secondary | ICD-10-CM

## 2016-06-09 DIAGNOSIS — E1140 Type 2 diabetes mellitus with diabetic neuropathy, unspecified: Secondary | ICD-10-CM | POA: Diagnosis not present

## 2016-06-09 DIAGNOSIS — E785 Hyperlipidemia, unspecified: Secondary | ICD-10-CM | POA: Diagnosis not present

## 2016-06-09 LAB — POCT GLYCOSYLATED HEMOGLOBIN (HGB A1C)

## 2016-06-09 MED ORDER — CHLORTHALIDONE 50 MG PO TABS: 50.0000 mg | ORAL_TABLET | Freq: Every day | ORAL | 6 refills | Status: DC

## 2016-06-09 NOTE — Progress Notes (Signed)
Name: Juan King   MRN: [id number]    DOB: 09/28/1950   Date:06/09/2016       Progress Note  Subjective  Chief Complaint  Chief Complaint  Patient presents with  . Hypertension    HPI Here for f/u of HBP.  Also with coronary artery disease, elevated lipids, DM, Asthma.  H feels ok.  He occ feels lightheaded with standing in AM.  No problem-specific Assessment & Plan notes found for this encounter.   Past Medical History:  Diagnosis Date  . Allergic rhinitis   . Coronary arteriosclerosis   . Diabetes mellitus type 2, controlled (HCC)   . Hyperlipidemia   . Hypertension   . Neuropathy of both feet   . Personal history of fall   . Primary hypertension   . Psoriasis   . Screening for depression   . Seasonal allergies     Past Surgical History:  Procedure Laterality Date  . CORONARY STENT PLACEMENT      Family History  Problem Relation Age of Onset  . Diabetes Mother   . Hypertension Mother   . Diabetes Father   . Vision loss Father   . Hypertension Father   . Diabetes Sister     Social History   Social History  . Marital status: Married    Spouse name: N/A  . Number of children: N/A  . Years of education: N/A   Occupational History  . Not on file.   Social History Main Topics  . Smoking status: Never Smoker  . Smokeless tobacco: Never Used  . Alcohol use No  . Drug use: No  . Sexual activity: Not on file   Other Topics Concern  . Not on file   Social History Narrative  . No narrative on file     Current Outpatient Prescriptions:  .  albuterol (PROVENTIL HFA;VENTOLIN HFA) 108 (90 Base) MCG/ACT inhaler, Inhale 2 puffs into the lungs every 6 (six) hours as needed for wheezing or shortness of breath., Disp: 1 Inhaler, Rfl: 12 .  amitriptyline (ELAVIL) 10 MG tablet, Take 1 tablet (10 mg total) by mouth at bedtime., Disp: 90 tablet, Rfl: 3 .  amLODipine (NORVASC) 10 MG tablet, Take 1 tablet (10 mg total) by mouth daily., Disp: 30 tablet, Rfl:  6 .  aspirin EC 81 MG tablet, Take 81 mg by mouth daily. Reported on 03/06/2016, Disp: , Rfl:  .  atorvastatin (LIPITOR) 80 MG tablet, Take 1 tablet (80 mg total) by mouth daily., Disp: 90 tablet, Rfl: 3 .  Blood Pressure Monitoring (BLOOD PRESSURE CUFF) MISC, 1 each by Does not apply route daily., Disp: 1 each, Rfl: 0 .  carvedilol (COREG) 3.125 MG tablet, Take 1 tablet (3.125 mg total) by mouth 2 (two) times daily with a meal., Disp: 60 tablet, Rfl: 6 .  chlorthalidone (HYGROTON) 50 MG tablet, Take 1 tablet (50 mg total) by mouth daily., Disp: 30 tablet, Rfl: 6 .  cholecalciferol (VITAMIN D) 1000 UNITS tablet, Take 1,000 Units by mouth daily. Reported on 03/06/2016, Disp: , Rfl:  .  gabapentin (NEURONTIN) 100 MG capsule, Take 1 capsule 3 times a day., Disp: 90 capsule, Rfl: 3 .  hydrALAZINE (APRESOLINE) 25 MG tablet, Take 1 tablet (25 mg total) by mouth 3 (three) times daily., Disp: 90 tablet, Rfl: 6 .  ibuprofen (ADVIL,MOTRIN) 600 MG tablet, Take 600 mg by mouth every 6 (six) hours as needed., Disp: , Rfl:  .  isosorbide mononitrate (IMDUR) 30 MG 24 hr tablet,  Take 1 tablet (30 mg total) by mouth daily., Disp: 90 tablet, Rfl: 3 .  latanoprost (XALATAN) 0.005 % ophthalmic solution, 1 drop at bedtime., Disp: , Rfl:  .  lisinopril (PRINIVIL,ZESTRIL) 40 MG tablet, Take 1 tablet (40 mg total) by mouth daily., Disp: 90 tablet, Rfl: 3 .  metFORMIN (GLUCOPHAGE) 500 MG tablet, Take 1/2 tablet by mouth each morning., Disp: 45 tablet, Rfl: 3 .  timolol (BETIMOL) 0.5 % ophthalmic solution, Place 1 drop into both eyes daily., Disp: , Rfl:  .  triamcinolone cream (KENALOG) 0.1 %, Apply 1 application topically 2 (two) times daily. Apply twice a day until clear., Disp: , Rfl:  .  latanoprost (XALATAN) 0.005 % ophthalmic solution, Place 1 drop into both eyes 2 (two) times daily. Reported on 03/06/2016, Disp: , Rfl:   Not on File   Review of Systems  Constitutional: Negative for chills, fever, malaise/fatigue  and weight loss.  HENT: Positive for hearing loss (needs hearing aids.).   Eyes: Negative for blurred vision and double vision.  Respiratory: Positive for shortness of breath. Negative for cough and wheezing.   Cardiovascular: Positive for chest pain (rare, resolves spont.) and palpitations (occ.). Negative for leg swelling.  Gastrointestinal: Negative for abdominal pain, blood in stool and heartburn.  Genitourinary: Negative for dysuria, frequency and urgency.  Skin: Negative for rash.  Neurological: Positive for dizziness (occ on standing.). Negative for tremors, weakness and headaches.      Objective  Vitals:   06/09/16 1101 06/09/16 1128 06/09/16 1143  BP: (!) 193/82 (!) 157/95 (!) 180/100  Pulse: 71 62   Resp: 16    Temp: 98.2 F (36.8 C)    TempSrc: Oral    Weight: 60.3 kg (133 lb)    Height: 5' (1.524 m)      Physical Exam  Constitutional: He is oriented to person, place, and time and well-developed, well-nourished, and in no distress. No distress.  HENT:  Head: Normocephalic and atraumatic.  Eyes: Conjunctivae and EOM are normal. Pupils are equal, round, and reactive to light. No scleral icterus.  Neck: Normal range of motion. Neck supple. Carotid bruit is not present. No thyromegaly present.  Cardiovascular: Normal rate, regular rhythm and normal heart sounds.  Exam reveals no gallop and no friction rub.   No murmur heard. Pulmonary/Chest: Effort normal and breath sounds normal. No respiratory distress. He has no wheezes. He has no rales.  Musculoskeletal: He exhibits no edema.  Lymphadenopathy:    He has no cervical adenopathy.  Neurological: He is alert and oriented to person, place, and time.  Vitals reviewed.      Recent Results (from the past 2160 hour(s))  POCT HgB A1C     Status: Abnormal   Collection Time: 06/09/16 11:30 AM  Result Value Ref Range   Hemoglobin A1C 6.8%      Assessment & Plan  Problem List Items Addressed This Visit       Cardiovascular and Mediastinum   BP (high blood pressure)   Relevant Medications   chlorthalidone (HYGROTON) 50 MG tablet   Other Relevant Orders   COMPLETE METABOLIC PANEL WITH GFR   CBC with Differential     Endocrine   Controlled type 2 diabetes with neuropathy (HCC)     Musculoskeletal and Integument   Psoriasis     Other   HLD (hyperlipidemia)   Relevant Medications   chlorthalidone (HYGROTON) 50 MG tablet   Other Relevant Orders   Lipid Profile    Other Visit  Diagnoses    Diabetes mellitus without complication (HCC)    -  Primary   Relevant Orders   POCT HgB A1C (Completed)   Immunization due       Relevant Orders   Flu vaccine HIGH DOSE PF (Fluzone High Dose)      Meds ordered this encounter  Medications  . chlorthalidone (HYGROTON) 50 MG tablet    Sig: Take 1 tablet (50 mg total) by mouth daily.    Dispense:  30 tablet    Refill:  6  1. Diabetes mellitus without complication (HCC)  - POCT HgB A1C-6.8 Cont med 2. Essential hypertension  - chlorthalidone (HYGROTON) 50 MG tablet; Take 1 tablet (50 mg total) by mouth daily.  Dispense: 30 tablet; Refill: 6 Cont other meds 3. Controlled type 2 diabetes with neuropathy (HCC)   4. Hyperlipidemia, unspecified hyperlipidemia type Cont med  5. Psoriasis   6. Immunization due HD flu shot given

## 2016-06-10 NOTE — Telephone Encounter (Signed)
Done

## 2016-06-19 ENCOUNTER — Other Ambulatory Visit: Payer: Commercial Managed Care - HMO

## 2016-06-24 ENCOUNTER — Telehealth: Payer: Self-pay

## 2016-06-24 ENCOUNTER — Encounter: Payer: Self-pay | Admitting: *Deleted

## 2016-06-24 DIAGNOSIS — H119 Unspecified disorder of conjunctiva: Secondary | ICD-10-CM | POA: Diagnosis not present

## 2016-06-24 NOTE — Telephone Encounter (Signed)
Dr. Benny LennertEftekhari NPI: [id number]  Appt: NOW and DX: Z01.00

## 2016-06-24 NOTE — Telephone Encounter (Signed)
Auth # [id number] Valid 06/24/16--12/11/2016. #6 visit.

## 2016-06-26 ENCOUNTER — Other Ambulatory Visit: Payer: Self-pay | Admitting: *Deleted

## 2016-06-28 NOTE — Patient Outreach (Signed)
[facility]) Care Management   06/26/16 Home visit   Juan King 01-08-1951 [id number]  Juan King is an 65 y.o. male  Subjective:  Pt reports saw Eye MD in [place] recently, scheduled to have surgery soon to have skin on left eye lid removed.  [name] (on consent) reports pt recently saw Dr. [name], MD pt on a stronger dose of BP medication (Chlorthalidone)because BP was high.  Friend reports he forgot about getting pt a BP machine, will work on getting one.  Pt reports he is compliant taking all of his medications, watching his salt intake, occasionally has fries.  Pt reports he continues to go to Adult Day Care 5 days a week. Friend Ron reports he does the cooking (at night), does not add salt to the food.   Objective:   ROS  Physical Exam  Constitutional: He is oriented to person, place, and time. He appears well-developed and well-nourished.  Cardiovascular: Regular rhythm and normal heart sounds.   HR 58.   Respiratory: Effort normal and breath sounds normal.  GI: Soft. Bowel sounds are normal.  Musculoskeletal: Normal range of motion.  Neurological: He is alert and oriented to person, place, and time.  Skin: Skin is warm and dry.  Psychiatric: He has a normal mood and affect. His behavior is normal. Judgment and thought content normal.    Vitals:   06/26/16 1654 06/26/16 1705  BP: (!) 176/100 (!) 180/100  Pulse:    Resp:       Encounter Medications:   Outpatient Encounter Prescriptions as of 06/26/2016  Medication Sig Note  . albuterol (PROVENTIL HFA;VENTOLIN HFA) 108 (90 Base) MCG/ACT inhaler Inhale 2 puffs into the lungs every 6 (six) hours as needed for wheezing or shortness of breath. 06/26/2016: Once a day when walking   . amLODipine (NORVASC) 10 MG tablet Take 1 tablet (10 mg total) by mouth daily.   Marland Kitchen aspirin EC 81 MG tablet Take 81 mg by mouth daily. Reported on 03/06/2016 04/17/2015: Received from: [place]: Take by mouth.  Marland Kitchen atorvastatin (LIPITOR) 80 MG tablet Take 1 tablet (80 mg total) by mouth daily.   . carvedilol (COREG) 3.125 MG tablet Take 1 tablet (3.125 mg total) by mouth 2 (two) times daily with a meal. 04/17/2016: Patient and friend need to get med from pharmacy.    . chlorthalidone (HYGROTON) 50 MG tablet Take 1 tablet (50 mg total) by mouth daily.   Marland Kitchen gabapentin (NEURONTIN) 100 MG capsule Take 1 capsule 3 times a day.   . hydrALAZINE (APRESOLINE) 25 MG tablet Take 1 tablet (25 mg total) by mouth 3 (three) times daily.   Marland Kitchen latanoprost (XALATAN) 0.005 % ophthalmic solution 1 drop at bedtime.   Marland Kitchen lisinopril (PRINIVIL,ZESTRIL) 40 MG tablet Take 1 tablet (40 mg total) by mouth daily.   . metFORMIN (GLUCOPHAGE) 500 MG tablet Take 1/2 tablet by mouth each morning.   . timolol (BETIMOL) 0.5 % ophthalmic solution Place 1 drop into both eyes daily.   Marland Kitchen triamcinolone cream (KENALOG) 0.1 % Apply 1 application topically 2 (two) times daily. Apply twice a day until clear.   Marland Kitchen amitriptyline (ELAVIL) 10 MG tablet Take 1 tablet (10 mg total) by mouth at bedtime. (Patient not taking: Reported on 06/26/2016)   . Blood Pressure Monitoring (BLOOD PRESSURE CUFF) MISC 1 each by Does not apply route daily. (Patient not taking: Reported on 06/26/2016)   . cholecalciferol (VITAMIN D) 1000  UNITS tablet Take 1,000 Units by mouth daily. Reported on 03/06/2016   . ibuprofen (ADVIL,MOTRIN) 600 MG tablet Take 600 mg by mouth every 6 (six) hours as needed.   . isosorbide mononitrate (IMDUR) 30 MG 24 hr tablet Take 1 tablet (30 mg total) by mouth daily. (Patient not taking: Reported on 06/26/2016)   . latanoprost (XALATAN) 0.005 % ophthalmic solution Place 1 drop into both eyes 2 (two) times daily. Reported on 03/06/2016 05/28/2016: Repeat    No facility-administered encounter medications on file as of 06/26/2016.     Functional Status:   In your present state of health, do you have any difficulty performing  the following activities: 04/14/2016 08/06/2015  Hearing? Y N  Vision? Y N  Difficulty concentrating or making decisions? Y N  Walking or climbing stairs? N Y  Dressing or bathing? N Y  Doing errands, shopping? [name] Donning  Preparing Food and eating ? Y -  Using the Toilet? N -  In the past six months, have you accidently leaked urine? N -  Do you have problems with loss of bowel control? N -  Managing your Medications? N -  Managing your Finances? Y -  Housekeeping or managing your Housekeeping? N -  Some recent data might be hidden    Fall/Depression Screening:    PHQ 2/9 Scores 05/28/2016 04/16/2016 04/14/2016 08/06/2015 04/17/2015  PHQ - 2 Score 0 0 0 0 0    Assessment:  Pleasant 20 year old gentleman, resides with friend Ron, friend assists pt as needed (fills pt's pill planner).   Lungs clear, no c/o sob, chest pain.                            Hypertension:  Pt's BP today 160/100 to 180/100 at rest.  No complaints of headache, sob, chest pain.  BP medications taken this am.    Plan: As discussed, friend to purchase BP machine for pt, both pt/friend to check BP daily,record, call MD if remain elevated.            Pt to continue to be adherent with taking all of his medications,             Low Na+ diet, limit processed foods.            RN CM to continue to provide pt with community nurse case               Management services, follow up again next month- home visit           RN CM to collaborate with [facility] pharmacist (active with pt)               As needed.    [facility] CM Care Plan Problem One   Flowsheet Row Most Recent Value  Care Plan Problem One  Hypetension- uncontrolled   Role Documenting the Problem One  Care Management Coordinator  Care Plan for Problem One  Active  [facility] Long Term Goal (31-90 days)  re established Pt's BP would decrease by 20 points within the next 40 days   [facility] Long Term Goal Start Date  06/26/16  Interventions for Problem One Long Term Goal  Reinforced with pt  ongoing compliance with Low Na+ diet, obtain BP machine, check daily.   [facility] CM Short Term Goal #1 (0-30 days)  Pt would start checking BP within the next 10 days   [facility] CM Short  Term Goal #1 Start Date  06/26/16  Interventions for Short Term Goal #1  Reinforced need for pt to obtain BP machine, check daily, record, call MD for high readings.   [facility] CM Short Term Goal #2 (0-30 days)  Pt's knowledge of Low Na+ would increase in the next 30 days   [facility] CM Short Term Goal #2 Start Date  05/28/16  [facility] CM Short Term Goal #2 Met Date  06/26/16      Zara Chess.   [place] Care Management  [phone]

## 2016-06-29 ENCOUNTER — Telehealth: Payer: Self-pay | Admitting: *Deleted

## 2016-06-29 ENCOUNTER — Other Ambulatory Visit: Payer: Self-pay | Admitting: *Deleted

## 2016-06-29 NOTE — Patient Outreach (Signed)
Follow up phone call successful, check to see if pt/friend purchased BP machine.   Spoke with Juan King (pt's friend,pt resides with, on consent form), verified pt's HIPAA.   Juan King reports pt does not have a BP machine, was going to get one tomorrow.  RN CM [place] MD informed of pt's recent EMS call.  Juan King reports EMS was called yesterday because pt was dizzy, pt checked, everything was good - sugar, BP.    Juan King reports pt was instructed by MD not to take Chlorthalidone 50 mg for one day, will hold it tomorrow.   RN CM discussed with pt's friend plan to provide pt with a BP machine tomorrow, demonstrate use to both him and pt.    Plan:  RN CM to do an acute home visit tomorrow, provide BP machine, demonstrate use to both pt and pt's friend.     [name]   Pierzchala RN CCM [facility]  [phone]

## 2016-06-29 NOTE — Telephone Encounter (Signed)
Ems was called:  Patient b/s 155 Pulse 72 bp 118/82 retake 133/67 Patient c/o dizziness after taking new bp medication.   Dr. Juanetta GoslingHawkins advised leaving off medication for 1 day. Recheck bp and notify office.  Message left for caretaker to return call.

## 2016-06-30 ENCOUNTER — Other Ambulatory Visit: Payer: Self-pay | Admitting: *Deleted

## 2016-06-30 NOTE — Patient Outreach (Signed)
[facility]) Care Management   06/30/2016  Juan King 16-Feb-1951 [id number]  Juan King is an 65 y.o. male  Subjective:  Juan King (pt's friend,pt resides with, on consent form)reports on recent EMS call- pt dizzy, BP readings good. Juan King reports pt did not take Chlorthalidone today per MD  Order.  Pt  reports he takes his am medications before he goes to the Adult day care center,brings his afternoon medications with  Him to the center- takes them around 9:30 am, has been times forgets.   Pt reports he does get dizzy/nervous at times while at the center, sits down until it goes away.     Objective:   Vitals:   06/30/16 1636 06/30/16 1651  BP: (!) 162/70 (!) 162/90  Pulse: 63     ROS  Physical Exam  Constitutional: He is oriented to person, place, and time. He appears well-developed and well-nourished.  Cardiovascular: Normal rate, regular rhythm and normal heart sounds.   Assessed by nursing student   Respiratory: Effort normal and breath sounds normal.  Assessed by nursing student   Musculoskeletal: Normal range of motion.  Neurological: He is alert and oriented to person, place, and time.  Skin: Skin is warm and dry.  Psychiatric: He has a normal mood and affect. His behavior is normal. Judgment and thought content normal.    Encounter Medications:   Outpatient Encounter Prescriptions as of 06/30/2016  Medication Sig Note  . albuterol (PROVENTIL HFA;VENTOLIN HFA) 108 (90 Base) MCG/ACT inhaler Inhale 2 puffs into the lungs every 6 (six) hours as needed for wheezing or shortness of breath. 06/26/2016: Once a day when walking   . amitriptyline (ELAVIL) 10 MG tablet Take 1 tablet (10 mg total) by mouth at bedtime. (Patient not taking: Reported on 06/26/2016)   . amLODipine (NORVASC) 10 MG tablet Take 1 tablet (10 mg total) by mouth daily.   Marland Kitchen aspirin EC 81 MG tablet Take 81 mg by mouth daily. Reported on 03/06/2016 04/17/2015: Received from: [facility] Received Sig: Take by mouth.  Marland Kitchen atorvastatin (LIPITOR) 80 MG tablet Take 1 tablet (80 mg total) by mouth daily.   . Blood Pressure Monitoring (BLOOD PRESSURE CUFF) MISC 1 each by Does not apply route daily. (Patient not taking: Reported on 06/26/2016)   . carvedilol (COREG) 3.125 MG tablet Take 1 tablet (3.125 mg total) by mouth 2 (two) times daily with a meal. 04/17/2016: Patient and friend need to get med from pharmacy.    . chlorthalidone (HYGROTON) 50 MG tablet Take 1 tablet (50 mg total) by mouth daily.   . cholecalciferol (VITAMIN D) 1000 UNITS tablet Take 1,000 Units by mouth daily. Reported on 03/06/2016   . gabapentin (NEURONTIN) 100 MG capsule Take 1 capsule 3 times a day.   . hydrALAZINE (APRESOLINE) 25 MG tablet Take 1 tablet (25 mg total) by mouth 3 (three) times daily.   Marland Kitchen ibuprofen (ADVIL,MOTRIN) 600 MG tablet Take 600 mg by mouth every 6 (six) hours as needed.   . isosorbide mononitrate (IMDUR) 30 MG 24 hr tablet Take 1 tablet (30 mg total) by mouth daily. (Patient not taking: Reported on 06/26/2016)   . latanoprost (XALATAN) 0.005 % ophthalmic solution Place 1 drop into both eyes 2 (two) times daily. Reported on 03/06/2016 05/28/2016: Repeat   . latanoprost (XALATAN) 0.005 % ophthalmic solution 1 drop at bedtime.   Marland Kitchen lisinopril (PRINIVIL,ZESTRIL) 40 MG tablet Take 1 tablet (40 mg total) by mouth daily.   Marland Kitchen  metFORMIN (GLUCOPHAGE) 500 MG tablet Take 1/2 tablet by mouth each morning.   . timolol (BETIMOL) 0.5 % ophthalmic solution Place 1 drop into both eyes daily.   Marland Kitchen triamcinolone cream (KENALOG) 0.1 % Apply 1 application topically 2 (two) times daily. Apply twice a day until clear.    No facility-administered encounter medications on file as of 06/30/2016.     Functional Status:   In your present state of health, do you have any difficulty performing the following activities: 04/14/2016 08/06/2015  Hearing? Y N  Vision? Y N  Difficulty concentrating or  making decisions? Y N  Walking or climbing stairs? N Y  Dressing or bathing? N Y  Doing errands, shopping? [name]  Preparing Food and eating ? Y -  Using the Toilet? N -  In the past six months, have you accidently leaked urine? N -  Do you have problems with loss of bowel control? N -  Managing your Medications? N -  Managing your Finances? Y -  Housekeeping or managing your Housekeeping? N -  Some recent data might be hidden    Fall/Depression Screening:    PHQ 2/9 Scores 05/28/2016 04/16/2016 04/14/2016 08/06/2015 04/17/2015  PHQ - 2 Score 0 0 0 0 0    Assessment:  Pleasant 27 year old gentleman, resides with friend (assists with medications, fills pill planner).            Hypertension- BP today sitting- 168/99 (pt's BP machine,                 Rechecked by nursing student Lenord Fellers 162/70, rechecked                  Again by RN CM 162/90.   RN CM provided pt with BP                 Machine, [facility] calendar to record readings.    Plan:  Pt/friend to check BP tomorrow, call MD with today's               Reading as well as tomorrow's.            Pt/friend to continue to check BP daily,record, call MD               If continue to be elevated, bring readings to next MD              Visit.              As discussed with pt, pt to wait to get home from adult               Day care center (ends 12 pm) to take afternoon              Medications/friend to place afternoon medications in               Pill planner.             RN CM to continue to collaborate with Caryn [facility]              Pharmacist as needed).            Plan to continue to provide pt with community nurse case              Management services, follow up again next month- home               Visit.    [facility]  CM Care Plan Problem One   Flowsheet Row Most Recent Value  Care Plan Problem One  Hypertension- uncontrolled   Role Documenting the Problem One  Care Management Coordinator  Care Plan for Problem One  Active  [facility] Long Term  Goal (31-90 days)  reestablished- pt's BP would decrease by 20 points within the next 40 days   [facility] Long Term Goal Start Date  06/26/16  Interventions for Problem One Long Term Goal  Acute home visit done, reviewed medication adherence, discussed importance of checking BP daily   [facility] CM Short Term Goal #1 (0-30 days)  Pt would start checking BP within the next 10 days   [facility] CM Short Term Goal #1 Start Date  06/26/16  Interventions for Short Term Goal #1  Provided pt with BP machine, [facility] calendar to record, demonstrated to pt/friend use of BP machine, both demonstrated back.       [name]   Pierzchala RN CCM [facility] Care [facility]  [phone]

## 2016-07-06 ENCOUNTER — Other Ambulatory Visit: Payer: Self-pay | Admitting: Pharmacist

## 2016-07-06 ENCOUNTER — Other Ambulatory Visit: Payer: Self-pay | Admitting: Family Medicine

## 2016-07-06 ENCOUNTER — Other Ambulatory Visit: Payer: Commercial Managed Care - HMO

## 2016-07-06 DIAGNOSIS — I10 Essential (primary) hypertension: Secondary | ICD-10-CM | POA: Diagnosis not present

## 2016-07-06 DIAGNOSIS — E785 Hyperlipidemia, unspecified: Secondary | ICD-10-CM | POA: Diagnosis not present

## 2016-07-06 LAB — CBC WITH DIFFERENTIAL/PLATELET
BASOS PCT: 0 %
Basophils Absolute: 0 cells/uL (ref 0–200)
EOS ABS: 67 {cells}/uL (ref 15–500)
Eosinophils Relative: 1 %
HEMATOCRIT: 43.1 % (ref 38.5–50.0)
Hemoglobin: 14.5 g/dL (ref 13.2–17.1)
LYMPHS PCT: 33 %
Lymphs Abs: 2211 cells/uL (ref 850–3900)
MCH: 31.1 pg (ref 27.0–33.0)
MCHC: 33.6 g/dL (ref 32.0–36.0)
MCV: 92.5 fL (ref 80.0–100.0)
MONO ABS: 737 {cells}/uL (ref 200–950)
MONOS PCT: 11 %
MPV: 9.9 fL (ref 7.5–12.5)
NEUTROS PCT: 55 %
Neutro Abs: 3685 cells/uL (ref 1500–7800)
PLATELETS: 280 10*3/uL (ref 140–400)
RBC: 4.66 MIL/uL (ref 4.20–5.80)
RDW: 14 % (ref 11.0–15.0)
WBC: 6.7 10*3/uL (ref 3.8–10.8)

## 2016-07-07 ENCOUNTER — Telehealth: Payer: Self-pay | Admitting: *Deleted

## 2016-07-07 ENCOUNTER — Other Ambulatory Visit: Payer: Self-pay | Admitting: Family Medicine

## 2016-07-07 LAB — COMPLETE METABOLIC PANEL WITH GFR
ALT: 12 U/L (ref 9–46)
AST: 13 U/L (ref 10–35)
Albumin: 3.5 g/dL — ABNORMAL LOW (ref 3.6–5.1)
Alkaline Phosphatase: 56 U/L (ref 40–115)
BILIRUBIN TOTAL: 0.6 mg/dL (ref 0.2–1.2)
BUN: 17 mg/dL (ref 7–25)
CHLORIDE: 107 mmol/L (ref 98–110)
CO2: 28 mmol/L (ref 20–31)
CREATININE: 1.11 mg/dL (ref 0.70–1.25)
Calcium: 9.4 mg/dL (ref 8.6–10.3)
GFR, EST AFRICAN AMERICAN: 79 mL/min (ref >=60)
GFR, Est Non African American: 68 mL/min (ref >=60)
Glucose, Bld: 127 mg/dL — ABNORMAL HIGH (ref 65–99)
Potassium: 4.1 mmol/L (ref 3.5–5.3)
Sodium: 143 mmol/L (ref 135–146)
TOTAL PROTEIN: 6.4 g/dL (ref 6.1–8.1)

## 2016-07-07 LAB — LIPID PANEL
CHOLESTEROL: 245 mg/dL — AB (ref 125–200)
HDL: 32 mg/dL — ABNORMAL LOW (ref >=40)
LDL Cholesterol: 188 mg/dL — ABNORMAL HIGH (ref <130)
TRIGLYCERIDES: 126 mg/dL (ref <150)
Total CHOL/HDL Ratio: 7.7 Ratio — ABNORMAL HIGH (ref <=5.0)
VLDL: 25 mg/dL (ref <30)

## 2016-07-07 MED ORDER — EZETIMIBE 10 MG PO TABS: 10.0000 mg | ORAL_TABLET | Freq: Every day | ORAL | 3 refills | Status: DC

## 2016-07-07 NOTE — Patient Outreach (Signed)
[facility]) Care Management  [facility]   07/07/2016  Juan HakeLarry D King 22-May-1951 [id number]  Late entry 07/06/16.    Subjective:  Follow-up home visit with patient and his friend/caregiver, Juan King, whom patient request be present during home visit.  Patient verified HIPAA details.    Patient reports he is taking his pills out of the pill planner, and Juan King states he is filling the pill planner.  Patient reports he takes his morning medication with him to the senior center and then takes his afternoon medication when he gets home from senior center.    Patient reports he has been monitoring his blood pressure at home and that he missed his medications on 07/02/16 and 07/04/16.    Objective:   Encounter Medications: Outpatient Encounter Prescriptions as of 07/06/2016  Medication Sig Note  . albuterol (PROVENTIL HFA;VENTOLIN HFA) 108 (90 Base) MCG/ACT inhaler Inhale 2 puffs into the lungs every 6 (six) hours as needed for wheezing or shortness of breath. 06/26/2016: Once a day when walking   . amLODipine (NORVASC) 10 MG tablet Take 1 tablet (10 mg total) by mouth daily.   Marland Kitchen. aspirin EC 81 MG tablet Take 81 mg by mouth daily. Reported on 03/06/2016 04/17/2015: [facility] Sig: Take by mouth.  Marland Kitchen. atorvastatin (LIPITOR) 80 MG tablet Take 1 tablet (80 mg total) by mouth daily.   . carvedilol (COREG) 3.125 MG tablet Take 1 tablet (3.125 mg total) by mouth 2 (two) times daily with a meal. 04/17/2016: Patient and friend need to get med from pharmacy.    . chlorthalidone (HYGROTON) 50 MG tablet Take 1 tablet (50 mg total) by mouth daily. 06/30/2016: Pt instructed to hold today 10/24, start back taking 10/25.   Marland Kitchen. gabapentin (NEURONTIN) 100 MG capsule Take 1 capsule 3 times a day.   . hydrALAZINE (APRESOLINE) 25 MG tablet Take 1 tablet (25 mg total) by mouth 3 (three) times daily.   Marland Kitchen. latanoprost (XALATAN) 0.005 % ophthalmic solution  Place 1 drop into both eyes 2 (two) times daily. Reported on 03/06/2016 05/28/2016: Repeat   . lisinopril (PRINIVIL,ZESTRIL) 40 MG tablet Take 1 tablet (40 mg total) by mouth daily.   . metFORMIN (GLUCOPHAGE) 500 MG tablet Take 1/2 tablet by mouth each morning.   . timolol (BETIMOL) 0.5 % ophthalmic solution Place 1 drop into both eyes daily.   Marland Kitchen. triamcinolone cream (KENALOG) 0.1 % Apply 1 application topically 2 (two) times daily. Apply twice a day until clear.   Marland Kitchen. amitriptyline (ELAVIL) 10 MG tablet Take 1 tablet (10 mg total) by mouth at bedtime. (Patient not taking: Reported on 07/07/2016)   . Blood Pressure Monitoring (BLOOD PRESSURE CUFF) MISC 1 each by Does not apply route daily.   . cholecalciferol (VITAMIN D) 1000 UNITS tablet Take 1,000 Units by mouth daily. Reported on 03/06/2016   . ibuprofen (ADVIL,MOTRIN) 600 MG tablet Take 600 mg by mouth every 6 (six) hours as needed.   . isosorbide mononitrate (IMDUR) 30 MG 24 hr tablet Take 1 tablet (30 mg total) by mouth daily. (Patient not taking: Reported on 07/07/2016)   . latanoprost (XALATAN) 0.005 % ophthalmic solution 1 drop at bedtime.    No facility-administered encounter medications on file as of 07/06/2016.     Functional Status: In your present state of health, do you have any difficulty performing the following activities: 04/14/2016 08/06/2015  Hearing? Y N  Vision? Y N  Difficulty concentrating or making decisions? YJeannie King  N  Walking or climbing stairs? N Y  Dressing or bathing? N Y  Doing errands, shopping? Juan JohnsY Y  Preparing Food and eating ? Y -  Using the Toilet? N -  In the past six months, have you accidently leaked urine? N -  Do you have problems with loss of bowel control? N -  Managing your Medications? N -  Managing your Finances? Y -  Housekeeping or managing your Housekeeping? N -  Some recent data might be hidden    Fall/Depression Screening: PHQ 2/9 Scores 05/28/2016 04/16/2016 04/14/2016 08/06/2015 04/17/2015  PHQ - 2  Score 0 0 0 0 0    Assessment:  Medication adherence:  Review of pill planner showed gabapentin was in it 4 times daily, instead of 3 times daily   Carvedilol was in it once daily instead of 2 times daily. Hydralazine and metformin were not in pill planner  Hydralazine was filled 05/14/16 for #90, 30 day supply.  Counseled patient it appears based on this information he is likely missing hydralazine doses.   Patient and Juan King agreed to put hydralazine in patient's pill planner 3 times daily.  Pill planner was adjusted to fix discrepancies with gabapentin, carvedilol to match directions from his prescriber.   Patient education:  Counseled patient to continue to take and record his blood pressure reading daily.  Counseled patient on importance of taking medications as directed by his prescriber.    Plan:  Will place follow-up phone call to patient's friend/caregiver, Juan King, in about 2 weeks per patient request.   Juan King, PharmD, [facility] Clinical Pharmacist [facility] [phone]

## 2016-07-10 NOTE — Telephone Encounter (Signed)
Spoke to caregiver re: lab results and new medication sent to pharmacy. Nothing further needed.

## 2016-07-21 ENCOUNTER — Other Ambulatory Visit: Payer: Self-pay | Admitting: Pharmacist

## 2016-07-21 NOTE — Patient Outreach (Signed)
[facility]) Care Management  07/21/2016  Juan King October 03, 1950 [id number]  Follow-up call to patient's friend/caregiver Juan King[facility].  He verified patient's HIPAA details.  Juan King states that patient has been taking his hydralazine and metformin more regularly.  He reports he is putting both medications in the weekly pill box.  He reports that he has added ezetimibe, which patient's PCP started last month as well.    Juan King reports patient did miss medication this past Sunday.  Encouraged Juan King to continue to try to have patient take his medications as prescribed by his prescribers.     Plan:  Home visit scheduled in ~2 weeks to review pill planner.    Tommye StandardKevin Davian Wollenberg, PharmD, [facility] Clinical Pharmacist [facility] [phone]

## 2016-07-28 ENCOUNTER — Other Ambulatory Visit: Payer: Self-pay | Admitting: *Deleted

## 2016-07-28 NOTE — Patient Outreach (Signed)
[facility]) Care Management   07/28/2016  Juan King May 22, 1951 [id number]  Juan King is an 65 y.o. male  Subjective:  Pt reports been checking his BP every day in  am.   Friend Ron reports he has been  Making sure pt is taking his medications in the morning-  Doing a pattern.   Pt reports recently  [place] in the gym, leg gave out, no injury, did not have his cane.   Pt reports deals with chronic  Tingling/numbness in legs, medication helps some.   Objective:   Vitals:   07/28/16 1632 07/28/16 1650  BP: (!) 160/80 (!) 155/96  Pulse: 61   Resp: 16     ROS  Physical Exam  Constitutional: He is oriented to person, place, and time. He appears well-developed and well-nourished.  Cardiovascular: Normal rate, regular rhythm and normal heart sounds.   Respiratory: Effort normal and breath sounds normal.  Musculoskeletal: Normal range of motion. He exhibits no edema.  Neurological: He is alert and oriented to person, place, and time.  Skin: Skin is warm and dry.  Psychiatric: He has a normal mood and affect. His behavior is normal. Judgment and thought content normal.    Encounter Medications:   Outpatient Encounter Prescriptions as of 07/28/2016  Medication Sig Note  . albuterol (PROVENTIL HFA;VENTOLIN HFA) 108 (90 Base) MCG/ACT inhaler Inhale 2 puffs into the lungs every 6 (six) hours as needed for wheezing or shortness of breath. 06/26/2016: Once a day when walking   . amLODipine (NORVASC) 10 MG tablet Take 1 tablet (10 mg total) by mouth daily.   Marland Kitchen aspirin EC 81 MG tablet Take 81 mg by mouth daily. Reported on 03/06/2016 04/17/2015: Received from: [place]: Take by mouth.  Marland Kitchen atorvastatin (LIPITOR) 80 MG tablet Take 1 tablet (80 mg total) by mouth daily.   . Blood Pressure Monitoring (BLOOD PRESSURE CUFF) MISC 1 each by Does not apply route daily.   . carvedilol (COREG) 3.125 MG tablet Take 1 tablet (3.125 mg total) by  mouth 2 (two) times daily with a meal. 04/17/2016: Patient and friend need to get med from pharmacy.    . chlorthalidone (HYGROTON) 50 MG tablet Take 1 tablet (50 mg total) by mouth daily. 06/30/2016: Pt instructed to hold today 10/24, start back taking 10/25.   Marland Kitchen ezetimibe (ZETIA) 10 MG tablet Take 1 tablet (10 mg total) by mouth daily.   Marland Kitchen gabapentin (NEURONTIN) 100 MG capsule Take 1 capsule 3 times a day.   . hydrALAZINE (APRESOLINE) 25 MG tablet Take 1 tablet (25 mg total) by mouth 3 (three) times daily.   Marland Kitchen ibuprofen (ADVIL,MOTRIN) 600 MG tablet Take 600 mg by mouth every 6 (six) hours as needed. 07/28/2016: As needed.   . isosorbide mononitrate (IMDUR) 30 MG 24 hr tablet Take 1 tablet (30 mg total) by mouth daily.   Marland Kitchen latanoprost (XALATAN) 0.005 % ophthalmic solution Place 1 drop into both eyes 2 (two) times daily. Reported on 03/06/2016 05/28/2016: Repeat   . lisinopril (PRINIVIL,ZESTRIL) 40 MG tablet Take 1 tablet (40 mg total) by mouth daily.   . metFORMIN (GLUCOPHAGE) 500 MG tablet Take 1/2 tablet by mouth each morning.   . timolol (BETIMOL) 0.5 % ophthalmic solution Place 1 drop into both eyes daily.   Marland Kitchen triamcinolone cream (KENALOG) 0.1 % Apply 1 application topically 2 (two) times daily. Apply twice a day until clear.   Marland Kitchen amitriptyline (ELAVIL) 10 MG tablet  Take 1 tablet (10 mg total) by mouth at bedtime. (Patient not taking: Reported on 07/28/2016)   . cholecalciferol (VITAMIN D) 1000 UNITS tablet Take 1,000 Units by mouth daily. Reported on 03/06/2016   . latanoprost (XALATAN) 0.005 % ophthalmic solution 1 drop at bedtime.    No facility-administered encounter medications on file as of 07/28/2016.     Functional Status:   In your present state of health, do you have any difficulty performing the following activities: 04/14/2016 08/06/2015  Hearing? Y N  Vision? Y N  Difficulty concentrating or making decisions? Y N  Walking or climbing stairs? N Y  Dressing or bathing? N Y  Doing  errands, shopping? [name] Donning  Preparing Food and eating ? Y -  Using the Toilet? N -  In the past six months, have you accidently leaked urine? N -  Do you have problems with loss of bowel control? N -  Managing your Medications? N -  Managing your Finances? Y -  Housekeeping or managing your Housekeeping? N -  Some recent data might be hidden    Fall/Depression Screening:    PHQ 2/9 Scores 05/28/2016 04/16/2016 04/14/2016 08/06/2015 04/17/2015  PHQ - 2 Score 0 0 0 0 0    Assessment:  Pleasant 6 year old gentleman, resides with friend Ron/friend's home.  Friend continues To manage pt's pill planner.  Lungs clear, no edema.                           Hypertension: BP today 160/80 right arm- nurse's cuff.  Taken with pt's BP machine- 155/96                           Left arm (what pt usually uses).                         Recent fall- leg gave out in gym (Adult day care), no injury, not using cane.  Discussed with                           Pt use of cane at all times.   Plan:     Hypertension: As discussed, pt to continue to check BP daily but to start checking 2-3 times a                 Week in the evening.  Also to continue to record all readings, bring results to upcoming MD                Office visit 12/7.               Plan to continue to provide pt with community nurse case management services, follow up again                  Next month- home visit.               Plan to send  Dr. [name] quarterly update letter as well as route 11/21 home visit encounter.   [facility] CM Care Plan Problem One   Flowsheet Row Most Recent Value  Care Plan Problem One  Hypertension - uncontrolled   Role Documenting the Problem One  Care Management [place] for Problem One  Active  [facility] Long Term Goal (31-90 days)  re established- pt would see  a drop in pm BP reading within the next 31 days   [facility] Long Term Goal Start Date  07/28/16  Interventions for Problem One Long Term Goal  Reviewed pt's BP  readings- down in am, today in pm elevated   [facility] CM Short Term Goal #1 (0-30 days)  Pt would start checking BP within the next 10 days   [facility] CM Short Term Goal #1 Start Date  06/26/16  [facility] CM Short Term Goal #1 Met Date  06/30/16 [met- review of pt's reading- checking BP in am since 10/24. ]      Zara Chess.   [facility] Care Management  [phone]

## 2016-07-29 ENCOUNTER — Other Ambulatory Visit: Payer: Self-pay | Admitting: Pharmacist

## 2016-07-29 ENCOUNTER — Encounter: Payer: Self-pay | Admitting: *Deleted

## 2016-07-29 NOTE — Patient Outreach (Signed)
[facility]) Care Management  07/29/2016  Juan King [birth year] [id number]   [facility] Pharmacist placed call to patient to reschedule home visit for next week due to scheduling conflict.  Left HIPAA compliant message requesting a return call.   Plan:  Will make another outreach attempt this week to reschedule home visit.   Tommye StandardKevin Willy Vorce, PharmD, [facility] Clinical Pharmacist [facility] [phone]

## 2016-07-31 ENCOUNTER — Other Ambulatory Visit: Payer: Self-pay | Admitting: Pharmacist

## 2016-07-31 NOTE — Patient Outreach (Signed)
[facility]) Care Management  07/31/2016  Girtha HakeLarry D Celestin 1951/01/15 [id number]  Successful phone outreach to patient's friend/caregiver, [name], HIPAA details verified, to re-schedule home visit.   Plan:  Home visit rescheduled.   Tommye StandardKevin Alois Colgan, PharmD, [facility] Clinical Pharmacist [facility] [phone]

## 2016-08-04 ENCOUNTER — Ambulatory Visit: Payer: Self-pay | Admitting: Pharmacist

## 2016-08-06 ENCOUNTER — Other Ambulatory Visit: Payer: Self-pay | Admitting: Pharmacist

## 2016-08-06 NOTE — Patient Outreach (Signed)
[facility] Care Management  [facility]   08/06/2016  Juan King Feb 14, 1951 [id number]  Subjective:  Follow-up home visit with patient and his friend/caregiver, Juan King, whom patient requested be present.    Patient verified HIPAA details.   Patient reports he is taking his medications from weekly pill planner.  Juan King reports he is filling patient's pill planner.  Patient and Juan King report that since Juan King started putting hydralazine and metformin in pill planner patient has been taking it more consistently versus when patient was taking them from the bottles.   Offered to review pill planner and Juan King/patient decline needing pill planner reviewed.   Patient reports he thinks he may have missed one day of medication in the past week.    Objective:   Encounter Medications: Outpatient Encounter Prescriptions as of 08/06/2016  Medication Sig Note  . albuterol (PROVENTIL HFA;VENTOLIN HFA) 108 (90 Base) MCG/ACT inhaler Inhale 2 puffs into the lungs every 6 (six) hours as needed for wheezing or shortness of breath. 06/26/2016: Once a day when walking   . amitriptyline (ELAVIL) 10 MG tablet Take 1 tablet (10 mg total) by mouth at bedtime. (Patient not taking: Reported on 07/28/2016)   . amLODipine (NORVASC) 10 MG tablet Take 1 tablet (10 mg total) by mouth daily.   Marland Kitchen. aspirin EC 81 MG tablet Take 81 mg by mouth daily. Reported on 03/06/2016 04/17/2015: [facility] Sig: Take by mouth.  Marland Kitchen. atorvastatin (LIPITOR) 80 MG tablet Take 1 tablet (80 mg total) by mouth daily.   . Blood Pressure Monitoring (BLOOD PRESSURE CUFF) MISC 1 each by Does not apply route daily.   . carvedilol (COREG) 3.125 MG tablet Take 1 tablet (3.125 mg total) by mouth 2 (two) times daily with a meal. 04/17/2016: Patient and friend need to get med from pharmacy.    . chlorthalidone (HYGROTON) 50 MG tablet Take 1 tablet (50 mg total) by mouth daily.  06/30/2016: Pt instructed to hold today 10/24, start back taking 10/25.   . cholecalciferol (VITAMIN D) 1000 UNITS tablet Take 1,000 Units by mouth daily. Reported on 03/06/2016   . ezetimibe (ZETIA) 10 MG tablet Take 1 tablet (10 mg total) by mouth daily.   Marland Kitchen. gabapentin (NEURONTIN) 100 MG capsule Take 1 capsule 3 times a day.   . hydrALAZINE (APRESOLINE) 25 MG tablet Take 1 tablet (25 mg total) by mouth 3 (three) times daily.   Marland Kitchen. ibuprofen (ADVIL,MOTRIN) 600 MG tablet Take 600 mg by mouth every 6 (six) hours as needed. 07/28/2016: As needed.   . isosorbide mononitrate (IMDUR) 30 MG 24 hr tablet Take 1 tablet (30 mg total) by mouth daily.   Marland Kitchen. latanoprost (XALATAN) 0.005 % ophthalmic solution Place 1 drop into both eyes 2 (two) times daily. Reported on 03/06/2016 05/28/2016: Repeat   . latanoprost (XALATAN) 0.005 % ophthalmic solution 1 drop at bedtime.   Marland Kitchen. lisinopril (PRINIVIL,ZESTRIL) 40 MG tablet Take 1 tablet (40 mg total) by mouth daily.   . metFORMIN (GLUCOPHAGE) 500 MG tablet Take 1/2 tablet by mouth each morning.   . timolol (BETIMOL) 0.5 % ophthalmic solution Place 1 drop into both eyes daily.   Marland Kitchen. triamcinolone cream (KENALOG) 0.1 % Apply 1 application topically 2 (two) times daily. Apply twice a day until clear.    No facility-administered encounter medications on file as of 08/06/2016.     Functional Status: In your present state of health, do you have any difficulty performing the following  activities: 04/14/2016  Hearing? Y  Vision? Y  Difficulty concentrating or making decisions? Y  Walking or climbing stairs? N  Dressing or bathing? N  Doing errands, shopping? Y  Preparing Food and eating ? Y  Using the Toilet? N  In the past six months, have you accidently leaked urine? N  Do you have problems with loss of bowel control? N  Managing your Medications? N  Managing your Finances? Y  Housekeeping or managing your Housekeeping? N  Some recent data might be hidden     Fall/Depression Screening: PHQ 2/9 Scores 05/28/2016 04/16/2016 04/14/2016 08/06/2015 04/17/2015  PHQ - 2 Score 0 0 0 0 0    Assessment:  Medication adherence:   Appears to be improved based on patient needing refills---Juan King noted that he is getting refills of chlorthalidone, carvedilol, hydralazine for patient.    Juan King asked about refilling lisinopril 20 mg 2 tabs daily.  Patient has lisinopril 40 mg daily in his possession and was counseled he can use lisinopril 40 mg daily to reduce daily pill burden.    Patient education:  Counseled patient to continue taking blood pressure daily and to take his readings to his PCP appointment with him next week.    Discussed potential case closure and transition to a RN Health Coach if patient continues to do well---they were receptive to potential RN Health Coach referral.      Plan:  Home visit scheduled for next month.   Juan King, PharmD, [facility] LPBCACP Clinical Pharmacist [facility] [phone]

## 2016-08-11 ENCOUNTER — Ambulatory Visit: Payer: Commercial Managed Care - HMO | Admitting: Podiatry

## 2016-08-11 ENCOUNTER — Telehealth: Payer: Self-pay | Admitting: Family Medicine

## 2016-08-11 NOTE — Telephone Encounter (Signed)
[employer] referral entered for Dr. Al CorpusHyatt #[id number] Valid 08/12/16-02/08/2017 #6 visits

## 2016-08-11 NOTE — Telephone Encounter (Signed)
Pt needs a [employer] referral to see Dr. Arbutus Pedodd Hyatt at [facility].  Please call Mr. Juan King so he can schedule appt [phone]

## 2016-08-13 ENCOUNTER — Encounter: Payer: Self-pay | Admitting: Family Medicine

## 2016-08-13 ENCOUNTER — Ambulatory Visit (INDEPENDENT_AMBULATORY_CARE_PROVIDER_SITE_OTHER): Payer: Commercial Managed Care - HMO | Admitting: Family Medicine

## 2016-08-13 ENCOUNTER — Encounter: Payer: Self-pay | Admitting: *Deleted

## 2016-08-13 VITALS — BP 150/100 | HR 73 | Temp 97.9°F | Resp 16 | Ht 60.0 in | Wt 132.0 lb

## 2016-08-13 DIAGNOSIS — E785 Hyperlipidemia, unspecified: Secondary | ICD-10-CM

## 2016-08-13 DIAGNOSIS — I209 Angina pectoris, unspecified: Secondary | ICD-10-CM

## 2016-08-13 DIAGNOSIS — I259 Chronic ischemic heart disease, unspecified: Secondary | ICD-10-CM

## 2016-08-13 DIAGNOSIS — E1140 Type 2 diabetes mellitus with diabetic neuropathy, unspecified: Secondary | ICD-10-CM

## 2016-08-13 DIAGNOSIS — I2510 Atherosclerotic heart disease of native coronary artery without angina pectoris: Secondary | ICD-10-CM | POA: Diagnosis not present

## 2016-08-13 DIAGNOSIS — I10 Essential (primary) hypertension: Secondary | ICD-10-CM

## 2016-08-13 MED ORDER — LISINOPRIL 20 MG PO TABS: 20.0000 mg | ORAL_TABLET | Freq: Every day | ORAL | 3 refills | Status: DC

## 2016-08-13 MED ORDER — ISOSORBIDE MONONITRATE ER 30 MG PO TB24: 30.0000 mg | ORAL_TABLET | Freq: Every day | ORAL | 3 refills | Status: AC

## 2016-08-13 NOTE — Progress Notes (Signed)
Name: Juan King   MRN: [id number]    DOB: 1951-06-29   Date:08/13/2016       Progress Note  Subjective  Chief Complaint  Chief Complaint  Patient presents with  . Hypertension  . Diabetes    HPI Here for f/u of HBP .  Hew is diabetic ut well controlled at present.  Elevated lipids.  Hew is not taking Lisinopril and Imdur.   Talking other meds.  BPs are recorded and bounce from 100 sys to 170 sys.  No problem-specific Assessment & Plan notes found for this encounter.   Past Medical History:  Diagnosis Date  . Allergic rhinitis   . Coronary arteriosclerosis   . Diabetes mellitus type 2, controlled ([place])   . Hyperlipidemia   . Hypertension   . Neuropathy of both feet   . Personal history of fall   . Primary hypertension   . Psoriasis   . Screening for depression   . Seasonal allergies     Past Surgical History:  Procedure Laterality Date  . CORONARY STENT PLACEMENT      Family History  Problem Relation Age of Onset  . Diabetes Mother   . Hypertension Mother   . Diabetes Father   . Vision loss Father   . Hypertension Father   . Diabetes Sister     Social History   Social History  . Marital status: Married    Spouse name: N/A  . Number of children: N/A  . Years of education: N/A   Occupational History  . Not on file.   Social History Main Topics  . Smoking status: Never Smoker  . Smokeless tobacco: Never Used  . Alcohol use No  . Drug use: No  . Sexual activity: Not on file   Other Topics Concern  . Not on file   Social History Narrative  . No narrative on file     Current Outpatient Prescriptions:  .  albuterol (PROVENTIL HFA;VENTOLIN HFA) 108 (90 Base) MCG/ACT inhaler, Inhale 2 puffs into the lungs every 6 (six) hours as needed for wheezing or shortness of breath., Disp: 1 Inhaler, Rfl: 12 .  amitriptyline (ELAVIL) 10 MG tablet, Take 1 tablet (10 mg total) by mouth at bedtime., Disp: 90 tablet, Rfl: 3 .  amLODipine (NORVASC) 10 MG  tablet, Take 1 tablet (10 mg total) by mouth daily., Disp: 30 tablet, Rfl: 6 .  aspirin EC 81 MG tablet, Take 81 mg by mouth daily. Reported on 03/06/2016, Disp: , Rfl:  .  atorvastatin (LIPITOR) 80 MG tablet, Take 1 tablet (80 mg total) by mouth daily., Disp: 90 tablet, Rfl: 3 .  Blood Pressure Monitoring (BLOOD PRESSURE CUFF) MISC, 1 each by Does not apply route daily., Disp: 1 each, Rfl: 0 .  carvedilol (COREG) 3.125 MG tablet, Take 1 tablet (3.125 mg total) by mouth 2 (two) times daily with a meal., Disp: 60 tablet, Rfl: 6 .  chlorthalidone (HYGROTON) 50 MG tablet, Take 1 tablet (50 mg total) by mouth daily., Disp: 30 tablet, Rfl: 6 .  cholecalciferol (VITAMIN D) 1000 UNITS tablet, Take 1,000 Units by mouth daily. Reported on 03/06/2016, Disp: , Rfl:  .  ezetimibe (ZETIA) 10 MG tablet, Take 1 tablet (10 mg total) by mouth daily., Disp: 90 tablet, Rfl: 3 .  gabapentin (NEURONTIN) 100 MG capsule, Take 1 capsule 3 times a day., Disp: 90 capsule, Rfl: 3 .  hydrALAZINE (APRESOLINE) 25 MG tablet, Take 1 tablet (25 mg total) by mouth 3 (three)  times daily., Disp: 90 tablet, Rfl: 6 .  ibuprofen (ADVIL,MOTRIN) 600 MG tablet, Take 600 mg by mouth every 6 (six) hours as needed., Disp: , Rfl:  .  latanoprost (XALATAN) 0.005 % ophthalmic solution, Place 1 drop into both eyes 2 (two) times daily. Reported on 03/06/2016, Disp: , Rfl:  .  latanoprost (XALATAN) 0.005 % ophthalmic solution, 1 drop at bedtime., Disp: , Rfl:  .  metFORMIN (GLUCOPHAGE) 500 MG tablet, Take 1/2 tablet by mouth each morning., Disp: 45 tablet, Rfl: 3 .  timolol (BETIMOL) 0.5 % ophthalmic solution, Place 1 drop into both eyes daily., Disp: , Rfl:  .  triamcinolone cream (KENALOG) 0.1 %, Apply 1 application topically 2 (two) times daily. Apply twice a day until clear., Disp: , Rfl:  .  isosorbide mononitrate (IMDUR) 30 MG 24 hr tablet, Take 1 tablet (30 mg total) by mouth daily., Disp: 90 tablet, Rfl: 3 .  lisinopril (PRINIVIL,ZESTRIL) 20 MG  tablet, Take 1 tablet (20 mg total) by mouth daily., Disp: 90 tablet, Rfl: 3  Not on File   Review of Systems  Constitutional: Negative for chills, fever, malaise/fatigue and weight loss.  HENT: Negative for hearing loss and tinnitus.   Eyes: Negative for blurred vision and double vision.  Respiratory: Positive for shortness of breath (with activity). Negative for cough and wheezing.   Cardiovascular: Negative for chest pain, palpitations and leg swelling.  Gastrointestinal: Negative for abdominal pain, blood in stool and heartburn.  Genitourinary: Negative for dysuria, frequency and urgency.  Musculoskeletal: Negative for joint pain and myalgias.  Skin: Negative for rash.  Neurological: Negative for dizziness, tingling, tremors, weakness and headaches.      Objective  Vitals:   08/13/16 0932 08/13/16 0958  BP: (!) 181/101 (!) 150/100  Pulse: 73   Resp: 16   Temp: 97.9 F (36.6 C)   TempSrc: Oral   Weight: 132 lb (59.9 kg)   Height: 5' (1.524 m)     Physical Exam  Constitutional: He is oriented to person, place, and time and well-developed, well-nourished, and in no distress. No distress.  HENT:  Head: Normocephalic and atraumatic.  Eyes: Conjunctivae and EOM are normal. Pupils are equal, round, and reactive to light. No scleral icterus.  Neck: Normal range of motion. Neck supple. Carotid bruit is not present. No thyromegaly present.  Cardiovascular: Normal rate, regular rhythm and normal heart sounds.  Exam reveals no gallop and no friction rub.   No murmur heard. Pulmonary/Chest: Effort normal and breath sounds normal. No respiratory distress. He has no wheezes. He has no rales.  Musculoskeletal: He exhibits no edema.  Lymphadenopathy:    He has no cervical adenopathy.  Neurological: He is alert and oriented to person, place, and time.  Vitals reviewed.      Recent Results (from the past 2160 hour(s))  POCT HgB A1C     Status: Abnormal   Collection Time:  06/09/16 11:30 AM  Result Value Ref Range   Hemoglobin A1C 6.8%   COMPLETE METABOLIC PANEL WITH GFR     Status: Abnormal   Collection Time: 07/06/16  8:11 AM  Result Value Ref Range   Sodium 143 135 - 146 mmol/L   Potassium 4.1 3.5 - 5.3 mmol/L   Chloride 107 98 - 110 mmol/L   CO2 28 20 - 31 mmol/L   Glucose, Bld 127 (H) 65 - 99 mg/dL   BUN 17 7 - 25 mg/dL   Creat 1.11 0.70 - 1.25 mg/dL  Comment:   For patients > or = 67 years of age: The upper reference limit for Creatinine is approximately 13% higher for people identified as African-American.      Total Bilirubin 0.6 0.2 - 1.2 mg/dL   Alkaline Phosphatase 56 40 - 115 U/L   AST 13 10 - 35 U/L   ALT 12 9 - 46 U/L   Total Protein 6.4 6.1 - 8.1 g/dL   Albumin 3.5 (L) 3.6 - 5.1 g/dL   Calcium 9.4 8.6 - 10.3 mg/dL   GFR, Est African American 79 >=60 mL/min   GFR, Est Non African American 68 >=60 mL/min  CBC with Differential/Platelet     Status: None   Collection Time: 07/06/16  8:11 AM  Result Value Ref Range   WBC 6.7 3.8 - 10.8 K/uL   RBC 4.66 4.20 - 5.80 MIL/uL   Hemoglobin 14.5 13.2 - 17.1 g/dL   HCT 43.1 38.5 - 50.0 %   MCV 92.5 80.0 - 100.0 fL   MCH 31.1 27.0 - 33.0 pg   MCHC 33.6 32.0 - 36.0 g/dL   RDW 14.0 11.0 - 15.0 %   Platelets 280 140 - 400 K/uL   MPV 9.9 7.5 - 12.5 fL   Neutro Abs 3,685 1,500 - 7,800 cells/uL   Lymphs Abs 2,211 850 - 3,900 cells/uL   Monocytes Absolute 737 200 - 950 cells/uL   Eosinophils Absolute 67 15 - 500 cells/uL   Basophils Absolute 0 0 - 200 cells/uL   Neutrophils Relative % 55 %   Lymphocytes Relative 33 %   Monocytes Relative 11 %   Eosinophils Relative 1 %   Basophils Relative 0 %   Smear Review Criteria for review not met   Lipid panel     Status: Abnormal   Collection Time: 07/06/16  8:11 AM  Result Value Ref Range   Cholesterol 245 (H) 125 - 200 mg/dL   Triglycerides 126 <150 mg/dL   HDL 32 (L) >=40 mg/dL   Total CHOL/HDL Ratio 7.7 (H) <=5.0 Ratio   VLDL 25 <30  mg/dL   LDL Cholesterol 188 (H) <130 mg/dL    Comment:   Total Cholesterol/HDL Ratio:CHD Risk                        Coronary Heart Disease Risk Table                                        Men       Women          1/2 Average Risk              3.4        3.3              Average Risk              5.0        4.4           2X Average Risk              9.6        7.1           3X Average Risk             23.4       11.0 Use the calculated Patient Ratio above and the CHD Risk  table  to determine the patient's CHD Risk.      Assessment & Plan  Problem List Items Addressed This Visit      Cardiovascular and Mediastinum   Arteriosclerosis of coronary artery   Relevant Medications   lisinopril (PRINIVIL,ZESTRIL) 20 MG tablet   isosorbide mononitrate (IMDUR) 30 MG 24 hr tablet   BP (high blood pressure) - Primary   Relevant Medications   lisinopril (PRINIVIL,ZESTRIL) 20 MG tablet   isosorbide mononitrate (IMDUR) 30 MG 24 hr tablet     Endocrine   Controlled type 2 diabetes with neuropathy (HCC)   Relevant Medications   lisinopril (PRINIVIL,ZESTRIL) 20 MG tablet     Other   Chest pain   HLD (hyperlipidemia)   Relevant Medications   lisinopril (PRINIVIL,ZESTRIL) 20 MG tablet   isosorbide mononitrate (IMDUR) 30 MG 24 hr tablet    Other Visit Diagnoses    ASCVD (arteriosclerotic cardiovascular disease)       Relevant Medications   lisinopril (PRINIVIL,ZESTRIL) 20 MG tablet   isosorbide mononitrate (IMDUR) 30 MG 24 hr tablet      Meds ordered this encounter  Medications  . lisinopril (PRINIVIL,ZESTRIL) 20 MG tablet    Sig: Take 1 tablet (20 mg total) by mouth daily.    Dispense:  90 tablet    Refill:  3  . isosorbide mononitrate (IMDUR) 30 MG 24 hr tablet    Sig: Take 1 tablet (30 mg total) by mouth daily.    Dispense:  90 tablet    Refill:  3   1. Essential hypertension  - lisinopril (PRINIVIL,ZESTRIL) 20 MG tablet; Take 1 tablet (20 mg total) by mouth daily.   Dispense: 90 tablet; Refill: 3 Cont other meds 2. Arteriosclerosis of coronary artery   3. Controlled type 2 diabetes with neuropathy (HCC) Cont meds  4. Hyperlipidemia, unspecified hyperlipidemia type Cont meds Check Lipid panel on return  5. Chest pain due to myocardial ischemia, unspecified ischemic chest pain type ([place])   6. ASCVD (arteriosclerotic cardiovascular disease)  - isosorbide mononitrate (IMDUR) 30 MG 24 hr tablet; Take 1 tablet (30 mg total) by mouth daily.  Dispense: 90 tablet; Refill: 3

## 2016-08-17 ENCOUNTER — Ambulatory Visit (INDEPENDENT_AMBULATORY_CARE_PROVIDER_SITE_OTHER): Payer: Commercial Managed Care - HMO | Admitting: Family Medicine

## 2016-08-17 ENCOUNTER — Encounter: Payer: Self-pay | Admitting: Family Medicine

## 2016-08-17 VITALS — BP 149/88 | HR 97 | Temp 97.8°F | Resp 16 | Ht 60.0 in | Wt 129.0 lb

## 2016-08-17 DIAGNOSIS — J40 Bronchitis, not specified as acute or chronic: Secondary | ICD-10-CM

## 2016-08-17 DIAGNOSIS — I10 Essential (primary) hypertension: Secondary | ICD-10-CM

## 2016-08-17 MED ORDER — AMLODIPINE BESYLATE 10 MG PO TABS: 10.0000 mg | ORAL_TABLET | Freq: Every day | ORAL | 3 refills | Status: DC

## 2016-08-17 MED ORDER — AMOXICILLIN-POT CLAVULANATE 875-125 MG PO TABS: 1.0000 | ORAL_TABLET | Freq: Two times a day (BID) | ORAL | 0 refills | Status: DC

## 2016-08-17 NOTE — Progress Notes (Signed)
Name: Juan King   MRN: [id number]    DOB: [birth year]   Date:08/17/2016       Progress Note  Subjective  Chief Complaint  Chief Complaint  Patient presents with  . Cough    HPI Here with 4 days of worsening cough with laryngitis.  No fever, but has had some chills.   Cough not productive.  + Wheezes.  Some SOB with activity.  Dizzy with activity  No problem-specific Assessment & Plan notes found for this encounter.   Past Medical History:  Diagnosis Date  . Allergic rhinitis   . Coronary arteriosclerosis   . Diabetes mellitus type 2, controlled ([place])   . Hyperlipidemia   . Hypertension   . Neuropathy of both feet   . Personal history of fall   . Primary hypertension   . Psoriasis   . Screening for depression   . Seasonal allergies     Social History  Substance Use Topics  . Smoking status: Never Smoker  . Smokeless tobacco: Never Used  . Alcohol use No     Current Outpatient Prescriptions:  .  albuterol (PROVENTIL HFA;VENTOLIN HFA) 108 (90 Base) MCG/ACT inhaler, Inhale 2 puffs into the lungs every 6 (six) hours as needed for wheezing or shortness of breath., Disp: 1 Inhaler, Rfl: 12 .  amitriptyline (ELAVIL) 10 MG tablet, Take 1 tablet (10 mg total) by mouth at bedtime., Disp: 90 tablet, Rfl: 3 .  amLODipine (NORVASC) 10 MG tablet, Take 1 tablet (10 mg total) by mouth daily., Disp: 30 tablet, Rfl: 6 .  aspirin EC 81 MG tablet, Take 81 mg by mouth daily. Reported on 03/06/2016, Disp: , Rfl:  .  atorvastatin (LIPITOR) 80 MG tablet, Take 1 tablet (80 mg total) by mouth daily., Disp: 90 tablet, Rfl: 3 .  Blood Pressure Monitoring (BLOOD PRESSURE CUFF) MISC, 1 each by Does not apply route daily., Disp: 1 each, Rfl: 0 .  carvedilol (COREG) 3.125 MG tablet, Take 1 tablet (3.125 mg total) by mouth 2 (two) times daily with a meal., Disp: 60 tablet, Rfl: 6 .  chlorthalidone (HYGROTON) 50 MG tablet, Take 1 tablet (50 mg total) by mouth daily., Disp: 30 tablet, Rfl: 6 .   cholecalciferol (VITAMIN D) 1000 UNITS tablet, Take 1,000 Units by mouth daily. Reported on 03/06/2016, Disp: , Rfl:  .  ezetimibe (ZETIA) 10 MG tablet, Take 1 tablet (10 mg total) by mouth daily., Disp: 90 tablet, Rfl: 3 .  gabapentin (NEURONTIN) 100 MG capsule, Take 1 capsule 3 times a day., Disp: 90 capsule, Rfl: 3 .  hydrALAZINE (APRESOLINE) 25 MG tablet, Take 1 tablet (25 mg total) by mouth 3 (three) times daily., Disp: 90 tablet, Rfl: 6 .  ibuprofen (ADVIL,MOTRIN) 600 MG tablet, Take 600 mg by mouth every 6 (six) hours as needed., Disp: , Rfl:  .  isosorbide mononitrate (IMDUR) 30 MG 24 hr tablet, Take 1 tablet (30 mg total) by mouth daily., Disp: 90 tablet, Rfl: 3 .  latanoprost (XALATAN) 0.005 % ophthalmic solution, Place 1 drop into both eyes 2 (two) times daily. Reported on 03/06/2016, Disp: , Rfl:  .  latanoprost (XALATAN) 0.005 % ophthalmic solution, 1 drop at bedtime., Disp: , Rfl:  .  lisinopril (PRINIVIL,ZESTRIL) 20 MG tablet, Take 1 tablet (20 mg total) by mouth daily., Disp: 90 tablet, Rfl: 3 .  metFORMIN (GLUCOPHAGE) 500 MG tablet, Take 1/2 tablet by mouth each morning., Disp: 45 tablet, Rfl: 3 .  timolol (BETIMOL) 0.5 % ophthalmic solution,  Place 1 drop into both eyes daily., Disp: , Rfl:  .  triamcinolone cream (KENALOG) 0.1 %, Apply 1 application topically 2 (two) times daily. Apply twice a day until clear., Disp: , Rfl:   Not on File  Review of Systems  Constitutional: Positive for chills. Negative for fever, malaise/fatigue and weight loss.  HENT: Positive for congestion and sore throat. Negative for hearing loss and tinnitus.   Eyes: Negative for blurred vision and double vision.  Respiratory: Positive for cough, shortness of breath and wheezing. Negative for hemoptysis and sputum production.   Cardiovascular: Negative for chest pain, palpitations and leg swelling.  Gastrointestinal: Negative for abdominal pain (from cough only), blood in stool and heartburn.   Genitourinary: Negative for dysuria, frequency and urgency.  Skin: Negative for itching and rash.  Neurological: Negative for dizziness, tingling, tremors, weakness and headaches.      Objective  Vitals:   08/17/16 1504  BP: (!) 149/88  Pulse: 97  Resp: 16  Temp: 97.8 F (36.6 C)  TempSrc: Oral  Weight: 129 lb (58.5 kg)  Height: 5' (1.524 m)     Physical Exam  Constitutional: He is well-developed, well-nourished, and in no distress. No distress.  HENT:  Head: Normocephalic and atraumatic.  Right Ear: External ear normal.  Left Ear: External ear normal.  Nose: Mucosal edema and rhinorrhea present. Right sinus exhibits no maxillary sinus tenderness and no frontal sinus tenderness. Left sinus exhibits no maxillary sinus tenderness and no frontal sinus tenderness.  Mouth/Throat: Oropharynx is clear and moist.  Eyes: Conjunctivae and EOM are normal. Pupils are equal, round, and reactive to light. No scleral icterus.  Neck: Normal range of motion. Neck supple.  Cardiovascular: Normal rate, regular rhythm and normal heart sounds.  Exam reveals no gallop and no friction rub.   No murmur heard. Pulmonary/Chest: Effort normal and breath sounds normal. No respiratory distress. He has no wheezes. He has no rales.  Some coarse rhonchi throughout.  Abdominal: He exhibits no distension and no mass. There is no tenderness.  Musculoskeletal: He exhibits no edema.  Lymphadenopathy:    He has no cervical adenopathy.  Vitals reviewed.     Recent Results (from the past 2160 hour(s))  POCT HgB A1C     Status: Abnormal   Collection Time: 06/09/16 11:30 AM  Result Value Ref Range   Hemoglobin A1C 6.8%   COMPLETE METABOLIC PANEL WITH GFR     Status: Abnormal   Collection Time: 07/06/16  8:11 AM  Result Value Ref Range   Sodium 143 135 - 146 mmol/L   Potassium 4.1 3.5 - 5.3 mmol/L   Chloride 107 98 - 110 mmol/L   CO2 28 20 - 31 mmol/L   Glucose, Bld 127 (H) 65 - 99 mg/dL   BUN 17 7  - 25 mg/dL   Creat 1.11 0.70 - 1.25 mg/dL    Comment:   For patients > or = 67 years of age: The upper reference limit for Creatinine is approximately 13% higher for people identified as African-American.      Total Bilirubin 0.6 0.2 - 1.2 mg/dL   Alkaline Phosphatase 56 40 - 115 U/L   AST 13 10 - 35 U/L   ALT 12 9 - 46 U/L   Total Protein 6.4 6.1 - 8.1 g/dL   Albumin 3.5 (L) 3.6 - 5.1 g/dL   Calcium 9.4 8.6 - 10.3 mg/dL   GFR, Est African American 79 >=60 mL/min   GFR, Est Non African  American 68 >=60 mL/min  CBC with Differential/Platelet     Status: None   Collection Time: 07/06/16  8:11 AM  Result Value Ref Range   WBC 6.7 3.8 - 10.8 K/uL   RBC 4.66 4.20 - 5.80 MIL/uL   Hemoglobin 14.5 13.2 - 17.1 g/dL   HCT 43.1 38.5 - 50.0 %   MCV 92.5 80.0 - 100.0 fL   MCH 31.1 27.0 - 33.0 pg   MCHC 33.6 32.0 - 36.0 g/dL   RDW 14.0 11.0 - 15.0 %   Platelets 280 140 - 400 K/uL   MPV 9.9 7.5 - 12.5 fL   Neutro Abs 3,685 1,500 - 7,800 cells/uL   Lymphs Abs 2,211 850 - 3,900 cells/uL   Monocytes Absolute 737 200 - 950 cells/uL   Eosinophils Absolute 67 15 - 500 cells/uL   Basophils Absolute 0 0 - 200 cells/uL   Neutrophils Relative % 55 %   Lymphocytes Relative 33 %   Monocytes Relative 11 %   Eosinophils Relative 1 %   Basophils Relative 0 %   Smear Review Criteria for review not met   Lipid panel     Status: Abnormal   Collection Time: 07/06/16  8:11 AM  Result Value Ref Range   Cholesterol 245 (H) 125 - 200 mg/dL   Triglycerides 126 <150 mg/dL   HDL 32 (L) >=40 mg/dL   Total CHOL/HDL Ratio 7.7 (H) <=5.0 Ratio   VLDL 25 <30 mg/dL   LDL Cholesterol 188 (H) <130 mg/dL    Comment:   Total Cholesterol/HDL Ratio:CHD Risk                        Coronary Heart Disease Risk Table                                        Men       Women          1/2 Average Risk              3.4        3.3              Average Risk              5.0        4.4           2X Average Risk               9.6        7.1           3X Average Risk             23.4       11.0 Use the calculated Patient Ratio above and the CHD Risk table  to determine the patient's CHD Risk.      Assessment & Plan  1. Bronchitis  - amoxicillin-clavulanate (AUGMENTIN) 875-125 MG tablet; Take 1 tablet by mouth 2 (two) times daily.  Dispense: 14 tablet; Refill: 0 Use Mucinex DM as needed for cough.

## 2016-08-17 NOTE — Addendum Note (Signed)
Addended by: Alease FrameARTER, SONYA S on: 08/17/2016 03:52 PM   Modules accepted: Orders

## 2016-08-18 ENCOUNTER — Other Ambulatory Visit: Payer: Self-pay | Admitting: Pharmacist

## 2016-08-19 NOTE — Patient Outreach (Signed)
[facility]) Care Management  [facility]   08/19/2016  Girtha HakeLarry D Geraci 1951/08/24 [id number]  Late entry for 08/18/16  Subjective:  Follow-up home visit with patient, and his caregiver, [name], present per patient request.  Patient verified HIPAA details.  [facility] Pharmacist, Laurelyn SickleKatina, also present during home visit.    Patient reports he has been taking his pills out of pill box.  Windy Fastonald, caregiver, reports he is filling patient's pill planner.    [name] reports patient is now taking lisinopril 20 mg daily.  He reports patient was previously taken lisinopril 40 mg and doesn't know why patient told PCP during 08/13/16 OV that he wasn't taking lisinopril.   Objective:   Encounter Medications: Outpatient Encounter Prescriptions as of 08/18/2016  Medication Sig Note  . albuterol (PROVENTIL HFA;VENTOLIN HFA) 108 (90 Base) MCG/ACT inhaler Inhale 2 puffs into the lungs every 6 (six) hours as needed for wheezing or shortness of breath. 06/26/2016: Once a day when walking   . amLODipine (NORVASC) 10 MG tablet Take 1 tablet (10 mg total) by mouth daily.   Marland Kitchen. amoxicillin-clavulanate (AUGMENTIN) 875-125 MG tablet Take 1 tablet by mouth 2 (two) times daily.   Marland Kitchen. aspirin EC 81 MG tablet Take 81 mg by mouth daily. Reported on 03/06/2016 04/17/2015: [facility] Sig: Take by mouth.  Marland Kitchen. atorvastatin (LIPITOR) 80 MG tablet Take 1 tablet (80 mg total) by mouth daily.   . carvedilol (COREG) 3.125 MG tablet Take 1 tablet (3.125 mg total) by mouth 2 (two) times daily with a meal. 04/17/2016: Patient and friend need to get med from pharmacy.    . chlorthalidone (HYGROTON) 50 MG tablet Take 1 tablet (50 mg total) by mouth daily. 06/30/2016: Pt instructed to hold today 10/24, start back taking 10/25.   Marland Kitchen. ezetimibe (ZETIA) 10 MG tablet Take 1 tablet (10 mg total) by mouth daily.   Marland Kitchen. gabapentin (NEURONTIN) 100 MG capsule Take 1 capsule 3 times a day.   .  hydrALAZINE (APRESOLINE) 25 MG tablet Take 1 tablet (25 mg total) by mouth 3 (three) times daily.   . isosorbide mononitrate (IMDUR) 30 MG 24 hr tablet Take 1 tablet (30 mg total) by mouth daily.   Marland Kitchen. latanoprost (XALATAN) 0.005 % ophthalmic solution Place 1 drop into both eyes 2 (two) times daily. Reported on 03/06/2016 05/28/2016: Repeat   . lisinopril (PRINIVIL,ZESTRIL) 20 MG tablet Take 1 tablet (20 mg total) by mouth daily.   . metFORMIN (GLUCOPHAGE) 500 MG tablet Take 1/2 tablet by mouth each morning.   Marland Kitchen. amitriptyline (ELAVIL) 10 MG tablet Take 1 tablet (10 mg total) by mouth at bedtime. (Patient not taking: Reported on 08/19/2016)   . Blood Pressure Monitoring (BLOOD PRESSURE CUFF) MISC 1 each by Does not apply route daily.   . cholecalciferol (VITAMIN D) 1000 UNITS tablet Take 1,000 Units by mouth daily. Reported on 03/06/2016   . ibuprofen (ADVIL,MOTRIN) 600 MG tablet Take 600 mg by mouth every 6 (six) hours as needed. 07/28/2016: As needed.   . latanoprost (XALATAN) 0.005 % ophthalmic solution 1 drop at bedtime.   . timolol (BETIMOL) 0.5 % ophthalmic solution Place 1 drop into both eyes daily.   Marland Kitchen. triamcinolone cream (KENALOG) 0.1 % Apply 1 application topically 2 (two) times daily. Apply twice a day until clear.    No facility-administered encounter medications on file as of 08/18/2016.     Functional Status: In your present state of health, do you have any  difficulty performing the following activities: 04/14/2016  Hearing? Y  Vision? Y  Difficulty concentrating or making decisions? Y  Walking or climbing stairs? N  Dressing or bathing? N  Doing errands, shopping? Y  Preparing Food and eating ? Y  Using the Toilet? N  In the past six months, have you accidently leaked urine? N  Do you have problems with loss of bowel control? N  Managing your Medications? N  Managing your Finances? Y  Housekeeping or managing your Housekeeping? N  Some recent data might be hidden     Fall/Depression Screening: PHQ 2/9 Scores 08/17/2016 05/28/2016 04/16/2016 04/14/2016 08/06/2015 04/17/2015  PHQ - 2 Score 0 0 0 0 0 0    Assessment:  Medication adherence:  Reviewed pill planner which Windy Fastonald filled.  Amlodipine x4 days and atorvastatin for at least 3 days were omitted.  Amlodipine was just refilled 08/17/16 for #90.    Pill planner has lisinopril 20 mg daily in it per most recent order from PCP.  Patient also has lisinopril 40 mg in his possession which he is no longer taking.    Patient had not started augmentin at time of home visit---counseled him to take it with food.   Blood pressure readings per patient's recorded blood pressure log:  08/11/16---122/88, pulse 93 08/12/16---149/99, pulse 96 08/13/16---151/101, pulse 82 08/14/16---135/88, pulse 91 08/15/16---142/105, pulse 130 08/16/16---108/80, pulse 125 08/17/16---136/79, pulse 103  Counseled patient to continue checking blood pressure and record result daily and recommend he contact PCP if pulse remains elevated.   If patient needs additional BP lowering and heart rate can tolerate it, consider if increasing carvedilol is appropriate.    Plan:  Will route note to PCP so PCP can review BP readings patient recorded.   Follow-up home visit scheduled with patient for the next 4 weeks.  Tommye StandardKevin Latana Colin, PharmD, [facility] Clinical Pharmacist [facility] [phone]

## 2016-08-20 NOTE — Progress Notes (Signed)
Will recheck BP and meds at his next visit within a month.-jh

## 2016-08-21 ENCOUNTER — Other Ambulatory Visit: Payer: Self-pay | Admitting: *Deleted

## 2016-08-21 NOTE — Patient Outreach (Signed)
[facility]) Care Management   08/21/2016  Juan King 22-Aug-1951 [id number]  Juan King is an 65 y.o. male  Subjective:  Pt reports had a cold, saw  Dr. Juanetta GoslingHawkins this week, been taking Muccinex DM at night, feels better.  Pt reports been checking  BP every am, recording.   Pt reports a   few days when he missed some of his medications but friend/caregiver Ron  Reports it was only one day.  Ron reports pill planner has helped with pt's adherence.   Objective:  Vitals:   08/21/16 1708 08/21/16 1715  BP: (!) 160/90 (!) 167/99  Pulse:    Resp: 20      ROS  Physical Exam  Encounter Medications:   Outpatient Encounter Prescriptions as of 08/21/2016  Medication Sig Note  . albuterol (PROVENTIL HFA;VENTOLIN HFA) 108 (90 Base) MCG/ACT inhaler Inhale 2 puffs into the lungs every 6 (six) hours as needed for wheezing or shortness of breath. 08/21/2016: Use twice a day when walking   . amLODipine (NORVASC) 10 MG tablet Take 1 tablet (10 mg total) by mouth daily.   Marland Kitchen. amoxicillin-clavulanate (AUGMENTIN) 875-125 MG tablet Take 1 tablet by mouth 2 (two) times daily.   Marland Kitchen. aspirin EC 81 MG tablet Take 81 mg by mouth daily. Reported on 03/06/2016 04/17/2015: [facility] Received Sig: Take by mouth.  Marland Kitchen. atorvastatin (LIPITOR) 80 MG tablet Take 1 tablet (80 mg total) by mouth daily.   . Blood Pressure Monitoring (BLOOD PRESSURE CUFF) MISC 1 each by Does not apply route daily.   . carvedilol (COREG) 3.125 MG tablet Take 1 tablet (3.125 mg total) by mouth 2 (two) times daily with a meal. 04/17/2016: Patient and friend need to get med from pharmacy.    . chlorthalidone (HYGROTON) 50 MG tablet Take 1 tablet (50 mg total) by mouth daily. 06/30/2016: Pt instructed to hold today 10/24, start back taking 10/25.   Marland Kitchen. ezetimibe (ZETIA) 10 MG tablet Take 1 tablet (10 mg total) by mouth daily.   Marland Kitchen. gabapentin (NEURONTIN) 100 MG capsule Take 1 capsule 3 times a  day.   . hydrALAZINE (APRESOLINE) 25 MG tablet Take 1 tablet (25 mg total) by mouth 3 (three) times daily.   . isosorbide mononitrate (IMDUR) 30 MG 24 hr tablet Take 1 tablet (30 mg total) by mouth daily.   Marland Kitchen. latanoprost (XALATAN) 0.005 % ophthalmic solution 1 drop at bedtime.   Marland Kitchen. lisinopril (PRINIVIL,ZESTRIL) 20 MG tablet Take 1 tablet (20 mg total) by mouth daily.   . metFORMIN (GLUCOPHAGE) 500 MG tablet Take 1/2 tablet by mouth each morning.   . timolol (BETIMOL) 0.5 % ophthalmic solution Place 1 drop into both eyes daily.   Marland Kitchen. triamcinolone cream (KENALOG) 0.1 % Apply 1 application topically 2 (two) times daily. Apply twice a day until clear.   Marland Kitchen. amitriptyline (ELAVIL) 10 MG tablet Take 1 tablet (10 mg total) by mouth at bedtime. (Patient not taking: Reported on 08/21/2016)   . cholecalciferol (VITAMIN D) 1000 UNITS tablet Take 1,000 Units by mouth daily. Reported on 03/06/2016   . ibuprofen (ADVIL,MOTRIN) 600 MG tablet Take 600 mg by mouth every 6 (six) hours as needed. 07/28/2016: As needed.    No facility-administered encounter medications on file as of 08/21/2016.     Functional Status:   In your present state of health, do you have any difficulty performing the following activities: 04/14/2016  Hearing? Y  Vision? Y  Difficulty concentrating  or making decisions? Y  Walking or climbing stairs? N  Dressing or bathing? N  Doing errands, shopping? Y  Preparing Food and eating ? Y  Using the Toilet? N  In the past six months, have you accidently leaked urine? N  Do you have problems with loss of bowel control? N  Managing your Medications? N  Managing your Finances? Y  Housekeeping or managing your Housekeeping? N  Some recent data might be hidden    Fall/Depression Screening:    PHQ 2/9 Scores 08/17/2016 05/28/2016 04/16/2016 04/14/2016 08/06/2015 04/17/2015  PHQ - 2 Score 0 0 0 0 0 0    Assessment:   Pleasant 65 year old gentleman, resides with friend Ron who manages his pill  planner.                        Lungs clear, no c/o pain.                         Hypertension:  BP today with nurse's cuff -166/100, recheck 160/90. Checked with pt's                            BP machine- 167/99.    Review of pt's recorded BP readings: 108/72 to 154/94 (taken in                                    Mornings).     Plan:   Pt to continue to check BP, record, take all medications as ordered.               As discussed with pt, plan to follow up again within 2 weeks- home visit, assess                BP readings after being on new BP medication (Imdur).             [facility] CM Care Plan Problem One   Flowsheet Row Most Recent Value  Care Plan Problem One  Hypertension -uncontrolled   Role Documenting the Problem One  Care Management Coordinator  Care Plan for Problem One  Active  [facility] Long Term Goal (31-90 days)  Re established- pt's BP would come down in pm within the next 31 days   [facility] Long Term Goal Start Date  08/21/16  Interventions for Problem One Long Term Goal  Reviewed pt's BP readings, discussed continue to check, record, take all medications as ordered (Lisinopril new dosage, Imdur recently started).      [name]   Epic Tribbett RN CCM [facility] Care [facility]  [phone]

## 2016-09-04 ENCOUNTER — Other Ambulatory Visit: Payer: Self-pay | Admitting: Pharmacist

## 2016-09-04 ENCOUNTER — Encounter: Payer: Self-pay | Admitting: *Deleted

## 2016-09-04 ENCOUNTER — Other Ambulatory Visit: Payer: Self-pay | Admitting: *Deleted

## 2016-09-04 VITALS — BP 156/99 | HR 89 | Resp 20

## 2016-09-04 DIAGNOSIS — J0100 Acute maxillary sinusitis, unspecified: Secondary | ICD-10-CM | POA: Diagnosis not present

## 2016-09-04 DIAGNOSIS — I10 Essential (primary) hypertension: Secondary | ICD-10-CM

## 2016-09-04 DIAGNOSIS — J209 Acute bronchitis, unspecified: Secondary | ICD-10-CM | POA: Diagnosis not present

## 2016-09-04 NOTE — Patient Outreach (Signed)
[facility]) Care Management   09/04/2016  Juan King 02-19-1951 [id number]  Juan HakeLarry D King is an 65 y.o. male  Subjective:  Friend Ron reports both he and pt just got back from  Urgent care-  Pt was informed he has a touch of bronchitis, medicine was called into pharmacy which includes antibiotic and inhaler.   Pt reports he has had this cold for a couple of weeks now, antibiotic Dr. Juanetta GoslingHawkins gave him did not work.  Pt reports he started  hurting in chest when he coughed, problems breathing which is reason for going to Urgent Care.  Pt's  Friend reports pt has an appointment to see Dr. Juanetta GoslingHawkins 09/08/16 related to his cold.  Pt reports he continues to check his BP daily,record, taking his medications as  Friend continues to fill pill planner weekly/give daily reminders.    Objective:   Vitals:   09/04/16 1514 09/04/16 1520  BP: (!) 150/98 (!) 156/99  Pulse: 89 89  Resp: 20     ROS  Physical Exam  Constitutional: He is oriented to person, place, and time. He appears well-developed and well-nourished.  Cardiovascular: Normal rate and regular rhythm.   Respiratory: Breath sounds normal.  Coughing- lungs clear.    GI: Soft. Bowel sounds are normal.  Musculoskeletal: Normal range of motion.  Neurological: He is alert and oriented to person, place, and time.  Skin: Skin is warm and dry.  Psychiatric: He has a normal mood and affect. His behavior is normal. Judgment and thought content normal.    Encounter Medications:   Outpatient Encounter Prescriptions as of 09/04/2016  Medication Sig Note  . albuterol (PROVENTIL HFA;VENTOLIN HFA) 108 (90 Base) MCG/ACT inhaler Inhale 2 puffs into the lungs every 6 (six) hours as needed for wheezing or shortness of breath. 08/21/2016: Use twice a day when walking   . amitriptyline (ELAVIL) 10 MG tablet Take 1 tablet (10 mg total) by mouth at bedtime. (Patient not taking: Reported on 08/21/2016)   . amLODipine (NORVASC) 10 MG  tablet Take 1 tablet (10 mg total) by mouth daily.   Marland Kitchen. amoxicillin-clavulanate (AUGMENTIN) 875-125 MG tablet Take 1 tablet by mouth 2 (two) times daily.   Marland Kitchen. aspirin EC 81 MG tablet Take 81 mg by mouth daily. Reported on 03/06/2016 04/17/2015: [facility] Received Sig: Take by mouth.  Marland Kitchen. atorvastatin (LIPITOR) 80 MG tablet Take 1 tablet (80 mg total) by mouth daily.   . Blood Pressure Monitoring (BLOOD PRESSURE CUFF) MISC 1 each by Does not apply route daily.   . carvedilol (COREG) 3.125 MG tablet Take 1 tablet (3.125 mg total) by mouth 2 (two) times daily with a meal. 04/17/2016: Patient and friend need to get med from pharmacy.    . chlorthalidone (HYGROTON) 50 MG tablet Take 1 tablet (50 mg total) by mouth daily. 06/30/2016: Pt instructed to hold today 10/24, start back taking 10/25.   . cholecalciferol (VITAMIN D) 1000 UNITS tablet Take 1,000 Units by mouth daily. Reported on 03/06/2016   . ezetimibe (ZETIA) 10 MG tablet Take 1 tablet (10 mg total) by mouth daily.   Marland Kitchen. gabapentin (NEURONTIN) 100 MG capsule Take 1 capsule 3 times a day.   . hydrALAZINE (APRESOLINE) 25 MG tablet Take 1 tablet (25 mg total) by mouth 3 (three) times daily.   Marland Kitchen. ibuprofen (ADVIL,MOTRIN) 600 MG tablet Take 600 mg by mouth every 6 (six) hours as needed. 07/28/2016: As needed.   . isosorbide mononitrate (  IMDUR) 30 MG 24 hr tablet Take 1 tablet (30 mg total) by mouth daily.   Marland Kitchen. latanoprost (XALATAN) 0.005 % ophthalmic solution 1 drop at bedtime.   Marland Kitchen. lisinopril (PRINIVIL,ZESTRIL) 20 MG tablet Take 1 tablet (20 mg total) by mouth daily.   . metFORMIN (GLUCOPHAGE) 500 MG tablet Take 1/2 tablet by mouth each morning.   . timolol (BETIMOL) 0.5 % ophthalmic solution Place 1 drop into both eyes daily.   Marland Kitchen. triamcinolone cream (KENALOG) 0.1 % Apply 1 application topically 2 (two) times daily. Apply twice a day until clear.    No facility-administered encounter medications on file as of 09/04/2016.      Functional Status:   In your present state of health, do you have any difficulty performing the following activities: 04/14/2016  Hearing? Y  Vision? Y  Difficulty concentrating or making decisions? Y  Walking or climbing stairs? N  Dressing or bathing? N  Doing errands, shopping? Y  Preparing Food and eating ? Y  Using the Toilet? N  In the past six months, have you accidently leaked urine? N  Do you have problems with loss of bowel control? N  Managing your Medications? N  Managing your Finances? Y  Housekeeping or managing your Housekeeping? N  Some recent data might be hidden    Fall/Depression Screening:    PHQ 2/9 Scores 08/17/2016 05/28/2016 04/16/2016 04/14/2016 08/06/2015 04/17/2015  PHQ - 2 Score 0 0 0 0 0 0    Assessment:  Pleasant 65 year old gentleman, coughing on and off during  home visit.  [facility] pharmacist arrived during home visit).   After coughing- auscultated lungs  Clear.   Hypertension:  View of pt's recent BP readings- this am 147/103, yesterday 120/84.  Others- 141/115, 157/105. During home visit -pt's left arm with nurse's cuff 160/98,  pt's BP machine 156/99.   Pt's Heart rate  ranges 89- 116     Plan:  As discussed with pt/friend - bring BP readings to upcoming MD visit, ask about BP perimeters to report.              As discussed with pt, [facility], [facility] health coach for disease management (hypertension).            Plan to send Dr. Blase MessHawkin by in basket update letter informing of discharge           And referral to Health coach.    [facility] CM Care Plan Problem One   Flowsheet Row Most Recent Value  Care Plan Problem One  Hypertension- uncontrolled   Role Documenting the Problem One  Care Management Coordinator  Care Plan for Problem One  Active  [facility] Long Term Goal (31-90 days)  Updated- pt's BP would decrease 20 points wiithin the next 60 days   [facility] Long Term Goal Start Date  09/04/16   Interventions for Problem One Long Term Goal  Reviewed pt's BP readings, reinforced continue to check daily/record/bring readings to next MD visit + ongoing medication adherence.

## 2016-09-04 NOTE — Patient Outreach (Signed)
[facility]) Care Management  [facility]   09/04/2016  Juan King [birth year] [id number]  Subjective:  Follow-up home visit with patient to review medication adherence.  Patient's friend/caregiver, Windy Fastonald, also present during home visit as well as [facility] RN Rose.   Patient reports he was seen at urgent care today and prescribed new medication, [name] reports they have not picked up medications yet and are unsure what was prescribed.   [name] reports he is filling pill box for patient weekly on Sunday and patient denies missed doses---they did not wish to review pill box during home visit.   Medication bottles were reviewed during home visit.    Objective:   Encounter Medications: Outpatient Encounter Prescriptions as of 09/04/2016  Medication Sig Note  . albuterol (PROVENTIL HFA;VENTOLIN HFA) 108 (90 Base) MCG/ACT inhaler Inhale 2 puffs into the lungs every 6 (six) hours as needed for wheezing or shortness of breath. 09/04/2016: Twice a day   . amitriptyline (ELAVIL) 10 MG tablet Take 1 tablet (10 mg total) by mouth at bedtime. (Patient not taking: Reported on 09/04/2016)   . amLODipine (NORVASC) 10 MG tablet Take 1 tablet (10 mg total) by mouth daily.   Marland Kitchen. amoxicillin-clavulanate (AUGMENTIN) 875-125 MG tablet Take 1 tablet by mouth 2 (two) times daily. (Patient not taking: Reported on 09/04/2016)   . aspirin EC 81 MG tablet Take 81 mg by mouth daily. Reported on 03/06/2016 04/17/2015: [facility] Received Sig: Take by mouth.  Marland Kitchen. atorvastatin (LIPITOR) 80 MG tablet Take 1 tablet (80 mg total) by mouth daily.   . Blood Pressure Monitoring (BLOOD PRESSURE CUFF) MISC 1 each by Does not apply route daily.   . carvedilol (COREG) 3.125 MG tablet Take 1 tablet (3.125 mg total) by mouth 2 (two) times daily with a meal. 04/17/2016: Patient and friend need to get med from pharmacy.    . chlorthalidone (HYGROTON) 50 MG tablet Take 1 tablet (50  mg total) by mouth daily. 06/30/2016: Pt instructed to hold today 10/24, start back taking 10/25.   . cholecalciferol (VITAMIN D) 1000 UNITS tablet Take 1,000 Units by mouth daily. Reported on 03/06/2016   . ezetimibe (ZETIA) 10 MG tablet Take 1 tablet (10 mg total) by mouth daily.   Marland Kitchen. gabapentin (NEURONTIN) 100 MG capsule Take 1 capsule 3 times a day.   . hydrALAZINE (APRESOLINE) 25 MG tablet Take 1 tablet (25 mg total) by mouth 3 (three) times daily.   Marland Kitchen. ibuprofen (ADVIL,MOTRIN) 600 MG tablet Take 600 mg by mouth every 6 (six) hours as needed. 07/28/2016: As needed.   . isosorbide mononitrate (IMDUR) 30 MG 24 hr tablet Take 1 tablet (30 mg total) by mouth daily.   Marland Kitchen. latanoprost (XALATAN) 0.005 % ophthalmic solution 1 drop at bedtime.   Marland Kitchen. lisinopril (PRINIVIL,ZESTRIL) 20 MG tablet Take 1 tablet (20 mg total) by mouth daily.   . metFORMIN (GLUCOPHAGE) 500 MG tablet Take 1/2 tablet by mouth each morning.   . timolol (BETIMOL) 0.5 % ophthalmic solution Place 1 drop into both eyes daily. 09/04/2016: Pt taking once a day   . triamcinolone cream (KENALOG) 0.1 % Apply 1 application topically 2 (two) times daily. Apply twice a day until clear.    No facility-administered encounter medications on file as of 09/04/2016.     Functional Status: In your present state of health, do you have any difficulty performing the following activities: 04/14/2016  Hearing? Y  Vision? Y  Difficulty concentrating or  making decisions? Y  Walking or climbing stairs? N  Dressing or bathing? N  Doing errands, shopping? Y  Preparing Food and eating ? Y  Using the Toilet? N  In the past six months, have you accidently leaked urine? N  Do you have problems with loss of bowel control? N  Managing your Medications? N  Managing your Finances? Y  Housekeeping or managing your Housekeeping? N  Some recent data might be hidden    Fall/Depression Screening: PHQ 2/9 Scores 08/17/2016 05/28/2016 04/16/2016 04/14/2016 08/06/2015  04/17/2015  PHQ - 2 Score 0 0 0 0 0 0    Assessment:  Patient has maintenance medications in his possession.     Phone call to [facility] pharmacy to verify what was prescribed---pharmacy reports patient prescribed benzonatate, augmentin, and fluticasone nasal spray.  Counseled patient/[name] that Medicare Part D does not cover cough medication (benzonatate).  [name] plans to pick up patient's prescriptions tonight.   Reviewed patient's blood pressure/pulse readings and noted he has had pulse readings about 100 beats per minute.  [facility] RN Rose also discussed and reviewed blood pressure readings with patient.  Patient encouraged to discuss this with his PCP at upcoming appointment.    Patient states he doesn't usually use albuterol inhaler prior to checking blood pressure----counseled patient a side effect of albuterol is increased heart rate and to make a note if he took his inhaler prior to checking his blood pressure/pulse.    Patient's medication adherence has improved and [facility] RN is transitioning patient to a [facility] Health Coach.    Plan:  Will place a follow-up call in the next 3 weeks.    Tommye StandardKevin Petrice Beedy, PharmD, [facility] Clinical Pharmacist [facility] [phone]

## 2016-09-08 ENCOUNTER — Ambulatory Visit (INDEPENDENT_AMBULATORY_CARE_PROVIDER_SITE_OTHER): Payer: Commercial Managed Care - HMO | Admitting: Family Medicine

## 2016-09-08 ENCOUNTER — Encounter: Payer: Self-pay | Admitting: Family Medicine

## 2016-09-08 VITALS — BP 135/79 | HR 97 | Temp 98.2°F | Resp 16 | Ht 60.0 in | Wt 126.0 lb

## 2016-09-08 DIAGNOSIS — I10 Essential (primary) hypertension: Secondary | ICD-10-CM

## 2016-09-08 DIAGNOSIS — T464X5A Adverse effect of angiotensin-converting-enzyme inhibitors, initial encounter: Secondary | ICD-10-CM | POA: Insufficient documentation

## 2016-09-08 DIAGNOSIS — R05 Cough: Secondary | ICD-10-CM

## 2016-09-08 MED ORDER — LOSARTAN POTASSIUM 50 MG PO TABS: 50.0000 mg | ORAL_TABLET | Freq: Every day | ORAL | 3 refills | Status: DC

## 2016-09-08 NOTE — Addendum Note (Signed)
Addended by: Della GooPIERZCHALA, Ricke Kimoto M on: 09/08/2016 09:37 AM   Modules accepted: Orders

## 2016-09-08 NOTE — Progress Notes (Signed)
Name: Juan King   MRN: [id number]    DOB: [birth year]   Date:09/08/2016       Progress Note  Subjective  Chief Complaint  Chief Complaint  Patient presents with  . Cough    HPI Here because of persistent cough.  Treated for bronchitis about 3 weeks ago, but continued to cough./  Went to Urgent Care 3-4 days ago and they put him back on Augmentin.  Still coughing.  May be a little better.  He does take Lisinopril for BP.  No problem-specific Assessment & Plan notes found for this encounter.   Past Medical History:  Diagnosis Date  . Allergic rhinitis   . Coronary arteriosclerosis   . Diabetes mellitus type 2, controlled ([facility])   . Hyperlipidemia   . Hypertension   . Neuropathy of both feet   . Personal history of fall   . Primary hypertension   . Psoriasis   . Screening for depression   . Seasonal allergies     Past Surgical History:  Procedure Laterality Date  . CORONARY STENT PLACEMENT      Family History  Problem Relation Age of Onset  . Diabetes Mother   . Hypertension Mother   . Diabetes Father   . Vision loss Father   . Hypertension Father   . Diabetes Sister     Social History   Social History  . Marital status: Married    Spouse name: N/A  . Number of children: N/A  . Years of education: N/A   Occupational History  . Not on file.   Social History Main Topics  . Smoking status: Never Smoker  . Smokeless tobacco: Never Used  . Alcohol use No  . Drug use: No  . Sexual activity: Not on file   Other Topics Concern  . Not on file   Social History Narrative  . No narrative on file     Current Outpatient Prescriptions:  .  albuterol (PROVENTIL HFA;VENTOLIN HFA) 108 (90 Base) MCG/ACT inhaler, Inhale 2 puffs into the lungs every 6 (six) hours as needed for wheezing or shortness of breath., Disp: 1 Inhaler, Rfl: 12 .  amitriptyline (ELAVIL) 10 MG tablet, Take 1 tablet (10 mg total) by mouth at bedtime., Disp: 90 tablet, Rfl: 3 .  amLODipine  (NORVASC) 10 MG tablet, Take 1 tablet (10 mg total) by mouth daily., Disp: 90 tablet, Rfl: 3 .  amoxicillin-clavulanate (AUGMENTIN) 875-125 MG tablet, Take 1 tablet by mouth 2 (two) times daily., Disp: , Rfl:  .  aspirin EC 81 MG tablet, Take 81 mg by mouth daily. Reported on 03/06/2016, Disp: , Rfl:  .  atorvastatin (LIPITOR) 80 MG tablet, Take 1 tablet (80 mg total) by mouth daily., Disp: 90 tablet, Rfl: 3 .  benzonatate (TESSALON) 200 MG capsule, Take 200 mg by mouth 3 (three) times daily as needed for cough., Disp: , Rfl:  .  Blood Pressure Monitoring (BLOOD PRESSURE CUFF) MISC, 1 each by Does not apply route daily., Disp: 1 each, Rfl: 0 .  carvedilol (COREG) 3.125 MG tablet, Take 1 tablet (3.125 mg total) by mouth 2 (two) times daily with a meal., Disp: 60 tablet, Rfl: 6 .  chlorthalidone (HYGROTON) 50 MG tablet, Take 1 tablet (50 mg total) by mouth daily., Disp: 30 tablet, Rfl: 6 .  cholecalciferol (VITAMIN D) 1000 UNITS tablet, Take 1,000 Units by mouth daily. Reported on 03/06/2016, Disp: , Rfl:  .  ezetimibe (ZETIA) 10 MG tablet, Take 1 tablet (10  mg total) by mouth daily., Disp: 90 tablet, Rfl: 3 .  gabapentin (NEURONTIN) 100 MG capsule, Take 1 capsule 3 times a day., Disp: 90 capsule, Rfl: 3 .  hydrALAZINE (APRESOLINE) 25 MG tablet, Take 1 tablet (25 mg total) by mouth 3 (three) times daily., Disp: 90 tablet, Rfl: 6 .  ibuprofen (ADVIL,MOTRIN) 600 MG tablet, Take 600 mg by mouth every 6 (six) hours as needed., Disp: , Rfl:  .  isosorbide mononitrate (IMDUR) 30 MG 24 hr tablet, Take 1 tablet (30 mg total) by mouth daily., Disp: 90 tablet, Rfl: 3 .  latanoprost (XALATAN) 0.005 % ophthalmic solution, 1 drop at bedtime., Disp: , Rfl:  .  metFORMIN (GLUCOPHAGE) 500 MG tablet, Take 1/2 tablet by mouth each morning., Disp: 45 tablet, Rfl: 3 .  timolol (BETIMOL) 0.5 % ophthalmic solution, Place 1 drop into both eyes daily., Disp: , Rfl:  .  triamcinolone cream (KENALOG) 0.1 %, Apply 1 application  topically 2 (two) times daily. Apply twice a day until clear., Disp: , Rfl:  .  losartan (COZAAR) 50 MG tablet, Take 1 tablet (50 mg total) by mouth daily., Disp: 90 tablet, Rfl: 3  Not on File   Review of Systems  Constitutional: Negative for chills, fever, malaise/fatigue and weight loss.  HENT: Negative for hearing loss and tinnitus.   Eyes: Negative for blurred vision and double vision.  Respiratory: Positive for cough and shortness of breath (with cough). Negative for hemoptysis, sputum production and wheezing.   Cardiovascular: Negative for chest pain, palpitations and leg swelling.  Gastrointestinal: Negative for abdominal pain, blood in stool and heartburn.  Genitourinary: Negative for dysuria, frequency and urgency.  Skin: Negative for rash.  Neurological: Positive for dizziness (with severe cough). Negative for tingling, tremors, weakness and headaches.      Objective  Vitals:   09/08/16 1350  BP: 135/79  Pulse: 97  Resp: 16  Temp: 98.2 F (36.8 C)  TempSrc: Oral  Weight: 126 lb (57.2 kg)  Height: 5' (1.524 m)    Physical Exam  Constitutional: He is oriented to person, place, and time and well-developed, well-nourished, and in no distress. No distress.  HENT:  Head: Normocephalic and atraumatic.  Nose: Rhinorrhea present. Right sinus exhibits no maxillary sinus tenderness and no frontal sinus tenderness. Left sinus exhibits no maxillary sinus tenderness and no frontal sinus tenderness.  Mouth/Throat: Oropharynx is clear and moist.  Cardiovascular: Normal rate, regular rhythm and normal heart sounds.   Pulmonary/Chest: Effort normal. No respiratory distress. He has no wheezes. He has no rales.  Decreased breath sounds throughout.  Musculoskeletal: He exhibits no edema.  Neurological: He is alert and oriented to person, place, and time.  Vitals reviewed.      Recent Results (from the past 2160 hour(s))  COMPLETE METABOLIC PANEL WITH GFR     Status: Abnormal    Collection Time: 07/06/16  8:11 AM  Result Value Ref Range   Sodium 143 135 - 146 mmol/L   Potassium 4.1 3.5 - 5.3 mmol/L   Chloride 107 98 - 110 mmol/L   CO2 28 20 - 31 mmol/L   Glucose, Bld 127 (H) 65 - 99 mg/dL   BUN 17 7 - 25 mg/dL   Creat 1.11 0.70 - 1.25 mg/dL    Comment:   For patients > or = 68 years of age: The upper reference limit for Creatinine is approximately 13% higher for people identified as African-American.      Total Bilirubin 0.6 0.2 -  1.2 mg/dL   Alkaline Phosphatase 56 40 - 115 U/L   AST 13 10 - 35 U/L   ALT 12 9 - 46 U/L   Total Protein 6.4 6.1 - 8.1 g/dL   Albumin 3.5 (L) 3.6 - 5.1 g/dL   Calcium 9.4 8.6 - 10.3 mg/dL   GFR, Est African American 79 >=60 mL/min   GFR, Est Non African American 68 >=60 mL/min  CBC with Differential/Platelet     Status: None   Collection Time: 07/06/16  8:11 AM  Result Value Ref Range   WBC 6.7 3.8 - 10.8 K/uL   RBC 4.66 4.20 - 5.80 MIL/uL   Hemoglobin 14.5 13.2 - 17.1 g/dL   HCT 43.1 38.5 - 50.0 %   MCV 92.5 80.0 - 100.0 fL   MCH 31.1 27.0 - 33.0 pg   MCHC 33.6 32.0 - 36.0 g/dL   RDW 14.0 11.0 - 15.0 %   Platelets 280 140 - 400 K/uL   MPV 9.9 7.5 - 12.5 fL   Neutro Abs 3,685 1,500 - 7,800 cells/uL   Lymphs Abs 2,211 850 - 3,900 cells/uL   Monocytes Absolute 737 200 - 950 cells/uL   Eosinophils Absolute 67 15 - 500 cells/uL   Basophils Absolute 0 0 - 200 cells/uL   Neutrophils Relative % 55 %   Lymphocytes Relative 33 %   Monocytes Relative 11 %   Eosinophils Relative 1 %   Basophils Relative 0 %   Smear Review Criteria for review not met   Lipid panel     Status: Abnormal   Collection Time: 07/06/16  8:11 AM  Result Value Ref Range   Cholesterol 245 (H) 125 - 200 mg/dL   Triglycerides 126 <150 mg/dL   HDL 32 (L) >=40 mg/dL   Total CHOL/HDL Ratio 7.7 (H) <=5.0 Ratio   VLDL 25 <30 mg/dL   LDL Cholesterol 188 (H) <130 mg/dL    Comment:   Total Cholesterol/HDL Ratio:CHD Risk                         Coronary Heart Disease Risk Table                                        Men       Women          1/2 Average Risk              3.4        3.3              Average Risk              5.0        4.4           2X Average Risk              9.6        7.1           3X Average Risk             23.4       11.0 Use the calculated Patient Ratio above and the CHD Risk table  to determine the patient's CHD Risk.      Assessment & Plan  Problem List Items Addressed This Visit      Cardiovascular and Mediastinum   BP (high blood pressure) -  Primary   Relevant Medications   losartan (COZAAR) 50 MG tablet     Other   ACE-inhibitor cough      Meds ordered this encounter  Medications  . amoxicillin-clavulanate (AUGMENTIN) 875-125 MG tablet    Sig: Take 1 tablet by mouth 2 (two) times daily.  . benzonatate (TESSALON) 200 MG capsule    Sig: Take 200 mg by mouth 3 (three) times daily as needed for cough.  . losartan (COZAAR) 50 MG tablet    Sig: Take 1 tablet (50 mg total) by mouth daily.    Dispense:  90 tablet    Refill:  3   1. ACE-inhibitor cough Discontinue Lisinopril  2. Essential hypertension  - losartan (COZAAR) 50 MG tablet; Take 1 tablet (50 mg total) by mouth daily.  Dispense: 90 tablet; Refill: 3 Cont other drugs.

## 2016-09-11 ENCOUNTER — Other Ambulatory Visit: Payer: Self-pay | Admitting: *Deleted

## 2016-09-11 NOTE — Patient Outreach (Signed)
Attempt made to contact pt, inform Health Coach will not be following for HTN disease management.   HIPAA compliant voice message left with contact name and number.   Plan: if no response, will attempt another call next week.    [name]   Pierzchala RN CCM [facility]  [phone]

## 2016-09-17 ENCOUNTER — Other Ambulatory Visit: Payer: Self-pay | Admitting: *Deleted

## 2016-09-17 NOTE — Patient Outreach (Signed)
4:14pm - Second attempt made to contact pt, discuss not being followed by Health Coach.   HIPPA compliant voice message left with contact number.    4:19 pm- attempt made to contact pt's friend Rosamaria LintsRonald Poole (on consent form, pt resides with).   HIPAA compliant voice message left with contact number.    Plan: If no response, plan to follow up again next week telephonically.    [name]   Pierzchala RN CCM [facility] Care Management  [phone]

## 2016-09-18 ENCOUNTER — Encounter: Payer: Self-pay | Admitting: *Deleted

## 2016-09-18 ENCOUNTER — Other Ambulatory Visit: Payer: Self-pay | Admitting: *Deleted

## 2016-09-18 NOTE — Patient Outreach (Addendum)
Received a  Return phone call from friend Rosamaria LintsRonald Poole ([facility] resides with[facility]) to voice message left yesterday.   Spoke with friend Ron, HIPAA/identity verified on patient, discussed purpose of call to inform pt will not be followed by [facility] health coach(HTN program not in effect yet).   Friend reports on pt's recent MD visit 1/2 for cough-  Lisinopril stopped, pt started on Losartan 1/3, still has deep dry cough during the night.  Friend reports pt continues with antibiotic, completed cough medication, seems to be doing better.   Friend reports pt's BP been doing good, recent readings provided as requested = 1/4- 176/109, 1/5- 131/112, 1/6- 141/93, 1/8- 141/99, 1/9- 116/99, 1/10- 152/93, 1/11 145/105, today 142/99.   HR readings 1/5- 121, 1/6- 103, 1/8- 109, 1/9- 110, 1/10- 110, 1/11- 100, today 99.  Informed friend several HR readings higher than norm but see are  going down, if continue to be high (above 100) to call MD to which friend voiced understanding.   RN CM discussed with friend pt's upcoming MD (Dr. Juanetta GoslingHawkins) visits 1/18, 2/8- pt being followed by MD  Closely, pt self monitoring BP - no further nurse case management needs.    Also discussed with friend  [facility] who will continue to follow pt so if community nurse case management needs are seen will notify RN CM.     Plan:  RN CM to update Dr. Juanetta GoslingHawkins -pt  not being followed by Health coach, RN CM not to continue to follow,            [facility] still active.         [name]   Jaidyn Usery RN CCM [facility] Care Management  [phone]

## 2016-09-21 ENCOUNTER — Ambulatory Visit: Payer: Self-pay | Admitting: *Deleted

## 2016-09-21 NOTE — Progress Notes (Signed)
Noted-jh 

## 2016-09-23 ENCOUNTER — Ambulatory Visit: Payer: Self-pay | Admitting: Pharmacist

## 2016-09-23 ENCOUNTER — Other Ambulatory Visit: Payer: Self-pay | Admitting: Pharmacist

## 2016-09-23 NOTE — Patient Outreach (Addendum)
[facility]) Care Management  09/23/2016  Juan King Aug 03, 1951 [id number]  Attempted to reach patient/caregiver to follow-up on medication needs.  No answer on phone, phone continued to ring with no voicemail picking up.   Plan:  Will make another attempt to reach patient in the next week.   Tommye StandardKevin Sangita Zani, PharmD, [facility] Clinical Pharmacist [facility] [phone]  1534 Addendum:  Windy Fastonald, patient's friend/caregiver returned [facility], [name].    [name] reports patient has been coughing less since PCP changed lisinopril to losartan but he reports patient still has some coughing---he states patient has PCP follow-up scheduled.    [name] states he continues to fill pill planner each week for patient and that to his knowledge patient only missed medication on 09/13/16 when patient missed the entire day of medication.   [name] reports blood pressure and heart rate readings as follows: 09/23/16---121/91, heart rate 97 09/22/16----124/101, heart rate 120 09/21/16---137/97, heart rate 101 09/20/16---147/100, heart rate 91 09/19/16---145/95, heart rate 91 09/18/16---142/92, heart rate 99 09/17/16---145/105, heart rate 100  Counseled Juan King to be sure to show PCP these readings.    [name] denies medication related questions/concerns at this time.   Plan:  Will follow-up via phone in the next 2 weeks with Windy Fastonald, friend/caregiver.    Will update PCP patient's pulse remains elevated per caregiver's report.   Tommye StandardKevin Sondra Blixt, PharmD, [facility] Center[facility]ACP Clinical Pharmacist [facility] [phone]

## 2016-09-24 ENCOUNTER — Ambulatory Visit: Payer: Commercial Managed Care - HMO | Admitting: Family Medicine

## 2016-09-28 ENCOUNTER — Telehealth: Payer: Self-pay | Admitting: *Deleted

## 2016-09-28 NOTE — Telephone Encounter (Signed)
Caregiver called to report patient to am/pm medications all together at one time. When asked why patient just answered he felt depressed. He has developed another system to separate am/pm meds. Patient says he felt fine.  1/19 BP 140/92 hr 96 1/20 BP 131/79 hr 91  (patient to double meds this day). 1/21 BP 163/105 hr 91 (held meds this day). 1/22 unknown asked if when patient returns home please check vitals.

## 2016-09-28 NOTE — Telephone Encounter (Signed)
Try to have patient take meds as directed.  Let us know if depression seems to be an ongoing, reg. problem.-jh

## 2016-09-29 NOTE — Telephone Encounter (Signed)
Caregiver advised.

## 2016-10-09 ENCOUNTER — Other Ambulatory Visit: Payer: Self-pay | Admitting: Pharmacist

## 2016-10-09 NOTE — Patient Outreach (Signed)
[facility] Care Management  10/09/2016  Juan HakeLarry D King 29-Nov-1950 [id number]  Unsuccessful phone outreach to patient/caregiver to follow-up medication adherence.   No answer, HIPAA compliant voice message left requesting return call.   Plan:  Will make another outreach attempt next week if no return call.   Tommye StandardKevin Khara Renaud, PharmD, [facility] Hospital[facility]ACP Clinical Pharmacist [facility] [phone]

## 2016-10-15 ENCOUNTER — Encounter: Payer: Self-pay | Admitting: *Deleted

## 2016-10-15 ENCOUNTER — Ambulatory Visit: Payer: Commercial Managed Care - HMO | Admitting: Family Medicine

## 2016-10-15 ENCOUNTER — Ambulatory Visit (INDEPENDENT_AMBULATORY_CARE_PROVIDER_SITE_OTHER): Payer: Commercial Managed Care - HMO | Admitting: Family Medicine

## 2016-10-15 ENCOUNTER — Encounter: Payer: Self-pay | Admitting: Family Medicine

## 2016-10-15 VITALS — BP 170/80 | HR 56 | Temp 97.8°F | Resp 16 | Ht 60.0 in | Wt 129.0 lb

## 2016-10-15 DIAGNOSIS — E119 Type 2 diabetes mellitus without complications: Secondary | ICD-10-CM

## 2016-10-15 DIAGNOSIS — F320 Major depressive disorder, single episode, mild: Secondary | ICD-10-CM

## 2016-10-15 DIAGNOSIS — E1140 Type 2 diabetes mellitus with diabetic neuropathy, unspecified: Secondary | ICD-10-CM | POA: Diagnosis not present

## 2016-10-15 DIAGNOSIS — I25110 Atherosclerotic heart disease of native coronary artery with unstable angina pectoris: Secondary | ICD-10-CM

## 2016-10-15 DIAGNOSIS — I10 Essential (primary) hypertension: Secondary | ICD-10-CM | POA: Diagnosis not present

## 2016-10-15 DIAGNOSIS — E785 Hyperlipidemia, unspecified: Secondary | ICD-10-CM | POA: Diagnosis not present

## 2016-10-15 MED ORDER — METFORMIN HCL 500 MG PO TABS: ORAL_TABLET | ORAL | 3 refills | Status: DC

## 2016-10-15 MED ORDER — AMITRIPTYLINE HCL 10 MG PO TABS: 10.0000 mg | ORAL_TABLET | Freq: Every day | ORAL | 6 refills | Status: DC

## 2016-10-15 MED ORDER — LOSARTAN POTASSIUM 100 MG PO TABS: 100.0000 mg | ORAL_TABLET | Freq: Every day | ORAL | 3 refills | Status: DC

## 2016-10-15 NOTE — Progress Notes (Signed)
Name: Juan King   MRN: [id number]    DOB: 02-03-1951   Date:10/15/2016       Progress Note  Subjective  Chief Complaint  Chief Complaint  Patient presents with  . Hypertension  . Depression    HPI Here for f/u of DM, HBP.  He has some depression.  Feels down and blue.  Has some insomnia.  Trouble going to sleep and staying asleep.  More emotional.  Feels sad. BPs at home in 125-150 sys range.  BSs over past few weeks have all been 215-245.  No problem-specific Assessment & Plan notes found for this encounter.   Past Medical History:  Diagnosis Date  . Allergic rhinitis   . Coronary arteriosclerosis   . Diabetes mellitus type 2, controlled (HCC)   . Hyperlipidemia   . Hypertension   . Neuropathy of both feet   . Personal history of fall   . Primary hypertension   . Psoriasis   . Screening for depression   . Seasonal allergies     Past Surgical History:  Procedure Laterality Date  . CORONARY STENT PLACEMENT      Family History  Problem Relation Age of Onset  . Diabetes Mother   . Hypertension Mother   . Diabetes Father   . Vision loss Father   . Hypertension Father   . Diabetes Sister     Social History   Social History  . Marital status: Married    Spouse name: N/A  . Number of children: N/A  . Years of education: N/A   Occupational History  . Not on file.   Social History Main Topics  . Smoking status: Never Smoker  . Smokeless tobacco: Never Used  . Alcohol use No  . Drug use: No  . Sexual activity: Not on file   Other Topics Concern  . Not on file   Social History Narrative  . No narrative on file     Current Outpatient Prescriptions:  .  albuterol (PROVENTIL HFA;VENTOLIN HFA) 108 (90 Base) MCG/ACT inhaler, Inhale 2 puffs into the lungs every 6 (six) hours as needed for wheezing or shortness of breath., Disp: 1 Inhaler, Rfl: 12 .  amLODipine (NORVASC) 10 MG tablet, Take 1 tablet (10 mg total) by mouth daily., Disp: 90 tablet, Rfl:  3 .  aspirin EC 81 MG tablet, Take 81 mg by mouth daily. Reported on 03/06/2016, Disp: , Rfl:  .  atorvastatin (LIPITOR) 80 MG tablet, Take 1 tablet (80 mg total) by mouth daily., Disp: 90 tablet, Rfl: 3 .  Blood Pressure Monitoring (BLOOD PRESSURE CUFF) MISC, 1 each by Does not apply route daily., Disp: 1 each, Rfl: 0 .  carvedilol (COREG) 3.125 MG tablet, Take 1 tablet (3.125 mg total) by mouth 2 (two) times daily with a meal., Disp: 60 tablet, Rfl: 6 .  chlorthalidone (HYGROTON) 50 MG tablet, Take 1 tablet (50 mg total) by mouth daily., Disp: 30 tablet, Rfl: 6 .  cholecalciferol (VITAMIN D) 1000 UNITS tablet, Take 1,000 Units by mouth daily. Reported on 03/06/2016, Disp: , Rfl:  .  ezetimibe (ZETIA) 10 MG tablet, Take 1 tablet (10 mg total) by mouth daily., Disp: 90 tablet, Rfl: 3 .  gabapentin (NEURONTIN) 100 MG capsule, Take 1 capsule 3 times a day., Disp: 90 capsule, Rfl: 3 .  hydrALAZINE (APRESOLINE) 25 MG tablet, Take 1 tablet (25 mg total) by mouth 3 (three) times daily., Disp: 90 tablet, Rfl: 6 .  ibuprofen (ADVIL,MOTRIN) 600 MG tablet,  Take 600 mg by mouth every 6 (six) hours as needed., Disp: , Rfl:  .  isosorbide mononitrate (IMDUR) 30 MG 24 hr tablet, Take 1 tablet (30 mg total) by mouth daily., Disp: 90 tablet, Rfl: 3 .  latanoprost (XALATAN) 0.005 % ophthalmic solution, 1 drop at bedtime., Disp: , Rfl:  .  losartan (COZAAR) 100 MG tablet, Take 1 tablet (100 mg total) by mouth daily., Disp: 90 tablet, Rfl: 3 .  metFORMIN (GLUCOPHAGE) 500 MG tablet, Take 1 tablet by mouth each morning., Disp: 90 tablet, Rfl: 3 .  timolol (BETIMOL) 0.5 % ophthalmic solution, Place 1 drop into both eyes daily., Disp: , Rfl:  .  triamcinolone cream (KENALOG) 0.1 %, Apply 1 application topically 2 (two) times daily. Apply twice a day until clear., Disp: , Rfl:  .  amitriptyline (ELAVIL) 10 MG tablet, Take 1 tablet (10 mg total) by mouth at bedtime., Disp: 30 tablet, Rfl: 6  Not on File   Review of  Systems  Constitutional: Negative for chills, fever, malaise/fatigue and weight loss.  HENT: Negative for hearing loss and tinnitus.   Eyes: Negative for blurred vision and double vision.  Respiratory: Negative for cough, hemoptysis and wheezing.   Cardiovascular: Negative for chest pain, palpitations and leg swelling.  Gastrointestinal: Negative for abdominal pain, blood in stool and heartburn.  Genitourinary: Negative for dysuria, frequency and urgency.  Musculoskeletal: Negative for joint pain and myalgias.  Skin: Negative for rash.  Neurological: Negative for dizziness, tingling, tremors, weakness and headaches.  Psychiatric/Behavioral: Positive for depression. The patient has insomnia. The patient is not nervous/anxious.       Objective  Vitals:   10/15/16 0909 10/15/16 0954  BP: (!) 178/79 (!) 170/80  Pulse: (!) 56   Resp: 16   Temp: 97.8 F (36.6 C)   TempSrc: Oral   Weight: 129 lb (58.5 kg)   Height: 5' (1.524 m)     Physical Exam  Constitutional: He is well-developed, well-nourished, and in no distress. No distress.  HENT:  Head: Normocephalic and atraumatic.  Eyes: Conjunctivae and EOM are normal. Pupils are equal, round, and reactive to light. No scleral icterus.  Neck: Normal range of motion. Neck supple. Carotid bruit is not present. No thyromegaly present.  Cardiovascular: Normal rate and normal heart sounds.  Exam reveals no gallop and no friction rub.   No murmur heard. Pulmonary/Chest: Effort normal. No respiratory distress. He has no wheezes. He has no rales.  Abdominal: Soft. Bowel sounds are normal. He exhibits no distension, no abdominal bruit and no mass. There is no tenderness.  Musculoskeletal: He exhibits no edema.  Lymphadenopathy:    He has no cervical adenopathy.  Neurological: He is alert.  Vitals reviewed.      No results found for this or any previous visit (from the past 2160 hour(s)).   Assessment & Plan  Problem List Items  Addressed This Visit      Cardiovascular and Mediastinum   BP (high blood pressure)   Relevant Medications   losartan (COZAAR) 100 MG tablet   Atherosclerosis of coronary artery   Relevant Medications   losartan (COZAAR) 100 MG tablet     Endocrine   Controlled type 2 diabetes with neuropathy (HCC) - Primary   Relevant Medications   losartan (COZAAR) 100 MG tablet   metFORMIN (GLUCOPHAGE) 500 MG tablet   Other Relevant Orders   HgB A1c   Diabetes mellitus (HCC)   Relevant Medications   losartan (COZAAR) 100 MG tablet  metFORMIN (GLUCOPHAGE) 500 MG tablet     Other   HLD (hyperlipidemia)   Relevant Medications   losartan (COZAAR) 100 MG tablet   Depression, major, single episode, mild (HCC)   Relevant Medications   amitriptyline (ELAVIL) 10 MG tablet      Meds ordered this encounter  Medications  . losartan (COZAAR) 100 MG tablet    Sig: Take 1 tablet (100 mg total) by mouth daily.    Dispense:  90 tablet    Refill:  3  . metFORMIN (GLUCOPHAGE) 500 MG tablet    Sig: Take 1 tablet by mouth each morning.    Dispense:  90 tablet    Refill:  3  . amitriptyline (ELAVIL) 10 MG tablet    Sig: Take 1 tablet (10 mg total) by mouth at bedtime.    Dispense:  30 tablet    Refill:  6   1. Controlled type 2 diabetes with neuropathy (HCC)  - HgB A1c - metFORMIN (GLUCOPHAGE) 500 MG tablet; Take 1 tablet by mouth each morning.  Dispense: 90 tablet; Refill: 3 - increased from 1/2 a day  2. Essential hypertension Cont Carvedilol, chlorthaladone, amlodipine. - losartan (COZAAR) 100 MG tablet; Take 1 tablet (100 mg total) by mouth daily.  Dispense: 90 tablet; Refill: 3  3. Atherosclerosis of native coronary artery with unstable angina pectoris, unspecified whether native or transplanted heart (HCC)   4. Hyperlipidemia, unspecified hyperlipidemia type Cont Zetia and Lipitor  5. Type 2 diabetes mellitus without complication, without long-term current use of insulin (HCC)  6.  Depression Restart Elavil, 10 mg hs.

## 2016-10-16 LAB — HEMOGLOBIN A1C
Hgb A1c MFr Bld: 6.1 % — ABNORMAL HIGH (ref <5.7)
Mean Plasma Glucose: 128 mg/dL

## 2016-10-22 ENCOUNTER — Other Ambulatory Visit: Payer: Self-pay | Admitting: Pharmacist

## 2016-10-22 DIAGNOSIS — L400 Psoriasis vulgaris: Secondary | ICD-10-CM | POA: Diagnosis not present

## 2016-10-22 NOTE — Patient Outreach (Signed)
[facility] Care Management  10/22/2016  Juan King 11/06/1950 [id number]  Second unsuccessful outreach attempt to patient/caregiver.  No answer, HIPAA compliant voice message left requesting return call.    Plan:  Will make another phone outreach next week if no return call.   Tommye StandardKevin Kordelia Severin, PharmD, [facility] CenterBCACP Clinical Pharmacist [facility] [phone]

## 2016-10-23 ENCOUNTER — Other Ambulatory Visit: Payer: Self-pay | Admitting: Pharmacist

## 2016-10-23 NOTE — Patient Outreach (Signed)
[facility]N) Care Management  10/23/2016  Girtha HakeLarry D Eagen [birth year] [id number]  Incoming call received from patient's friend/caregiver, Windy Fastonald, on [facility], HIPAA details verified.   [name] reports patient has been compliant with taking his am and pm medications.  He reports he is sending patient's afternoon medications with patient to senior center to help patient take afternoon medications on time.    [name] denies issues or concerns filling patient's medication box for the week.    [name] reports patient is working on getting his own apartment, and he reports patient plans to manage medications on his own.  Discussed with [name], [facility] can assist him and patient in evaluating if there is a pharmacy in the area that may offer blister packaging and or delivery of medications to assist patient if/when he moves on his own.    Encouraged [name] to continue filling patient's medication planner and to continue to encourage patient to take his medications as directed by his prescriber.   Plan:  Will make outreach call to patient/caregiver next month.    May need to research alternative medication packaging/delivery options offered by area pharmacies for patient.   Tommye StandardKevin Muaz Shorey, PharmD, [facility] Clinical Pharmacist [facility] [phone]

## 2016-10-29 ENCOUNTER — Ambulatory Visit: Payer: Self-pay | Admitting: Pharmacist

## 2016-11-16 ENCOUNTER — Encounter: Payer: Self-pay | Admitting: Family Medicine

## 2016-11-16 ENCOUNTER — Ambulatory Visit (INDEPENDENT_AMBULATORY_CARE_PROVIDER_SITE_OTHER): Payer: Commercial Managed Care - HMO | Admitting: Family Medicine

## 2016-11-16 VITALS — BP 185/90 | HR 72 | Temp 98.0°F | Resp 16 | Ht 60.0 in | Wt 131.0 lb

## 2016-11-16 DIAGNOSIS — E1140 Type 2 diabetes mellitus with diabetic neuropathy, unspecified: Secondary | ICD-10-CM

## 2016-11-16 DIAGNOSIS — I2510 Atherosclerotic heart disease of native coronary artery without angina pectoris: Secondary | ICD-10-CM | POA: Diagnosis not present

## 2016-11-16 DIAGNOSIS — E785 Hyperlipidemia, unspecified: Secondary | ICD-10-CM

## 2016-11-16 DIAGNOSIS — I10 Essential (primary) hypertension: Secondary | ICD-10-CM

## 2016-11-16 MED ORDER — CARVEDILOL 6.25 MG PO TABS: 6.2500 mg | ORAL_TABLET | Freq: Two times a day (BID) | ORAL | 6 refills | Status: DC

## 2016-11-16 NOTE — Progress Notes (Signed)
Name: Juan King   MRN: [id number]    DOB: [birth year]   Date:11/16/2016       Progress Note  Subjective  Chief Complaint  Chief Complaint  Patient presents with  . Hypertension  . Diabetes    HPI Here for f/u of HBP.  He is taking all meds.  B Ss have been good.  BP at home in 130-150/85 range.  Pulses in 70-90 range.  Overall feels pretty , but occ feels dizzy after taking meds.  No problem-specific Assessment & Plan notes found for this encounter.   Past Medical History:  Diagnosis Date  . Allergic rhinitis   . Coronary arteriosclerosis   . Diabetes mellitus type 2, controlled (HCC)   . Hyperlipidemia   . Hypertension   . Neuropathy of both feet   . Personal history of fall   . Primary hypertension   . Psoriasis   . Screening for depression   . Seasonal allergies     Past Surgical History:  Procedure Laterality Date  . CORONARY STENT PLACEMENT      Family History  Problem Relation Age of Onset  . Diabetes Mother   . Hypertension Mother   . Diabetes Father   . Vision loss Father   . Hypertension Father   . Diabetes Sister     Social History   Social History  . Marital status: Married    Spouse name: N/A  . Number of children: N/A  . Years of education: N/A   Occupational History  . Not on file.   Social History Main Topics  . Smoking status: Never Smoker  . Smokeless tobacco: Never Used  . Alcohol use No  . Drug use: No  . Sexual activity: Not on file   Other Topics Concern  . Not on file   Social History Narrative  . No narrative on file     Current Outpatient Prescriptions:  .  albuterol (PROVENTIL HFA;VENTOLIN HFA) 108 (90 Base) MCG/ACT inhaler, Inhale 2 puffs into the lungs every 6 (six) hours as needed for wheezing or shortness of breath., Disp: 1 Inhaler, Rfl: 12 .  amitriptyline (ELAVIL) 10 MG tablet, Take 1 tablet (10 mg total) by mouth at bedtime., Disp: 30 tablet, Rfl: 6 .  amLODipine (NORVASC) 10 MG tablet, Take 1 tablet  (10 mg total) by mouth daily., Disp: 90 tablet, Rfl: 3 .  aspirin EC 81 MG tablet, Take 81 mg by mouth daily. Reported on 03/06/2016, Disp: , Rfl:  .  atorvastatin (LIPITOR) 80 MG tablet, Take 1 tablet (80 mg total) by mouth daily., Disp: 90 tablet, Rfl: 3 .  Blood Pressure Monitoring (BLOOD PRESSURE CUFF) MISC, 1 each by Does not apply route daily., Disp: 1 each, Rfl: 0 .  carvedilol (COREG) 6.25 MG tablet, Take 1 tablet (6.25 mg total) by mouth 2 (two) times daily with a meal., Disp: 60 tablet, Rfl: 6 .  chlorthalidone (HYGROTON) 50 MG tablet, Take 1 tablet (50 mg total) by mouth daily., Disp: 30 tablet, Rfl: 6 .  cholecalciferol (VITAMIN D) 1000 UNITS tablet, Take 1,000 Units by mouth daily. Reported on 03/06/2016, Disp: , Rfl:  .  ezetimibe (ZETIA) 10 MG tablet, Take 1 tablet (10 mg total) by mouth daily., Disp: 90 tablet, Rfl: 3 .  gabapentin (NEURONTIN) 100 MG capsule, Take 1 capsule 3 times a day., Disp: 90 capsule, Rfl: 3 .  hydrALAZINE (APRESOLINE) 25 MG tablet, Take 1 tablet (25 mg total) by mouth 3 (three) times daily.,  Disp: 90 tablet, Rfl: 6 .  ibuprofen (ADVIL,MOTRIN) 600 MG tablet, Take 600 mg by mouth every 6 (six) hours as needed., Disp: , Rfl:  .  isosorbide mononitrate (IMDUR) 30 MG 24 hr tablet, Take 1 tablet (30 mg total) by mouth daily., Disp: 90 tablet, Rfl: 3 .  latanoprost (XALATAN) 0.005 % ophthalmic solution, 1 drop at bedtime., Disp: , Rfl:  .  losartan (COZAAR) 100 MG tablet, Take 1 tablet (100 mg total) by mouth daily., Disp: 90 tablet, Rfl: 3 .  metFORMIN (GLUCOPHAGE) 500 MG tablet, Take 1 tablet by mouth each morning., Disp: 90 tablet, Rfl: 3 .  timolol (BETIMOL) 0.5 % ophthalmic solution, Place 1 drop into both eyes daily., Disp: , Rfl:  .  triamcinolone cream (KENALOG) 0.1 %, Apply 1 application topically 2 (two) times daily. Apply twice a day until clear., Disp: , Rfl:   Not on File   Review of Systems  Constitutional: Negative for chills, fever, malaise/fatigue  and weight loss.  HENT: Negative for hearing loss and tinnitus.   Eyes: Negative for blurred vision and double vision.  Respiratory: Negative for cough, shortness of breath and wheezing.   Cardiovascular: Negative for chest pain, palpitations and leg swelling.  Gastrointestinal: Negative for abdominal pain, blood in stool and heartburn.  Genitourinary: Negative for dysuria, frequency and urgency.  Musculoskeletal: Negative for joint pain and myalgias.  Skin: Negative for rash.  Neurological: Positive for dizziness (occ). Negative for tingling, tremors, weakness and headaches.      Objective  Vitals:   11/16/16 0850 11/16/16 0947  BP: (!) 191/93 (!) 185/90  Pulse: 77 72  Resp: 16   Temp: 98 F (36.7 C)   TempSrc: Oral   Weight: 131 lb (59.4 kg)   Height: 5' (1.524 m)     Physical Exam  Constitutional: He is oriented to person, place, and time and well-developed, well-nourished, and in no distress. No distress.  HENT:  Head: Normocephalic and atraumatic.  Eyes: Conjunctivae and EOM are normal. Pupils are equal, round, and reactive to light. No scleral icterus.  Neck: Normal range of motion. Neck supple. No thyromegaly present.  Cardiovascular: Normal rate, regular rhythm and normal heart sounds.  Exam reveals no gallop and no friction rub.   No murmur heard. Pulmonary/Chest: Effort normal and breath sounds normal. No respiratory distress. He has no wheezes. He has no rales.  Abdominal: Soft. Bowel sounds are normal. He exhibits no distension and no mass. There is no tenderness.  Musculoskeletal: He exhibits no edema.  Lymphadenopathy:    He has no cervical adenopathy.  Neurological: He is alert and oriented to person, place, and time.  Vitals reviewed.      Recent Results (from the past 2160 hour(s))  HgB A1c     Status: Abnormal   Collection Time: 10/15/16 10:03 AM  Result Value Ref Range   Hgb A1c MFr Bld 6.1 (H) <5.7 %    Comment:   For someone without known  diabetes, a hemoglobin A1c value between 5.7% and 6.4% is consistent with prediabetes and should be confirmed with a follow-up test.   For someone with known diabetes, a value <7% indicates that their diabetes is well controlled. A1c targets should be individualized based on duration of diabetes, age, co-morbid conditions and other considerations.   This assay result is consistent with an increased risk of diabetes.   Currently, no consensus exists regarding use of hemoglobin A1c for diagnosis of diabetes in children.  Mean Plasma Glucose 128 mg/dL     Assessment & Plan  Problem List Items Addressed This Visit      Cardiovascular and Mediastinum   Arteriosclerosis of coronary artery   Relevant Medications   carvedilol (COREG) 6.25 MG tablet   BP (high blood pressure) - Primary   Relevant Medications   carvedilol (COREG) 6.25 MG tablet     Endocrine   Controlled type 2 diabetes with neuropathy (HCC)     Other   HLD (hyperlipidemia)   Relevant Medications   carvedilol (COREG) 6.25 MG tablet      Meds ordered this encounter  Medications  . carvedilol (COREG) 6.25 MG tablet    Sig: Take 1 tablet (6.25 mg total) by mouth 2 (two) times daily with a meal.    Dispense:  60 tablet    Refill:  6   1. Essential hypertension Cont other meds - carvedilol (COREG) 6.25 MG tablet; Take 1 tablet (6.25 mg total) by mouth 2 (two) times daily with a meal.  Dispense: 60 tablet; Refill: 6  2. Arteriosclerosis of coronary artery Cont meds  3. Controlled type 2 diabetes with neuropathy (HCC) Cont med  4. Hyperlipidemia, unspecified hyperlipidemia type Cont meds

## 2016-11-17 ENCOUNTER — Ambulatory Visit: Payer: Self-pay | Admitting: Pharmacist

## 2016-11-18 ENCOUNTER — Ambulatory Visit: Payer: Self-pay | Admitting: Pharmacist

## 2016-11-19 ENCOUNTER — Other Ambulatory Visit: Payer: Self-pay | Admitting: Pharmacist

## 2016-11-19 NOTE — Patient Outreach (Signed)
[facility]N) Care Management  11/19/2016  Juan King 06/10/1951 [id number]  Successful phone outreach to patient.   Patient reports he moved and got his own apartment.  Previously his friend was assisting with managing patient's medications.  Patient states he is "doing good with medications."    He [facility] and he reports he is filling his pill box.  He reports there is a family who sometimes helps him fill pill box.    Discussed adherence packaging such as bubble packing with patient he was interested.   Plan:  Will look into pharmacies who offer bubble packaging in the [place] area for patient.   Home visit scheduled with patient.   Juan King, PharmD, [facility] Clinical Pharmacist [facility] [phone]

## 2016-11-24 ENCOUNTER — Telehealth: Payer: Self-pay | Admitting: Family Medicine

## 2016-11-24 NOTE — Telephone Encounter (Signed)
Graciella Freeronna [place], in-take coordinator with [facility] needs pt's records from 09/08/2015 to present by Friday for FL2 and assessment.  Her call back number is [phone] and fax [phone].

## 2016-11-27 ENCOUNTER — Other Ambulatory Visit: Payer: Self-pay | Admitting: Pharmacist

## 2016-11-27 NOTE — Patient Outreach (Signed)
[facility]) Care Management  [facility]   11/27/2016  Juan HakeLarry D King July 15, 1951 [id number]  Subjective:  Home visit with patient to review medication pill planner and medication adherence.  Patient reports he is now living in his own apartment and enjoying it.    He reports he still [facility] to senior center and is now cooking some of his own meals ---he denies using salt to cook with.    Patient reports his friend Juan King or friend's sister is working to fill his pill box for him and states now using two pill boxes to have refilled every 14 days.   Objective:   Encounter Medications: Outpatient Encounter Prescriptions as of 11/27/2016  Medication Sig Note  . amitriptyline (ELAVIL) 10 MG tablet Take 1 tablet (10 mg total) by mouth at bedtime.   Marland Kitchen. amLODipine (NORVASC) 10 MG tablet Take 1 tablet (10 mg total) by mouth daily.   Marland Kitchen. aspirin EC 81 MG tablet Take 81 mg by mouth daily. Reported on 03/06/2016 04/17/2015: [facility] Re[facility] Sig: Take by mouth.  Marland Kitchen. atorvastatin (LIPITOR) 80 MG tablet Take 1 tablet (80 mg total) by mouth daily.   . carvedilol (COREG) 6.25 MG tablet Take 1 tablet (6.25 mg total) by mouth 2 (two) times daily with a meal.   . ezetimibe (ZETIA) 10 MG tablet Take 1 tablet (10 mg total) by mouth daily.   . hydrALAZINE (APRESOLINE) 25 MG tablet Take 1 tablet (25 mg total) by mouth 3 (three) times daily.   . isosorbide mononitrate (IMDUR) 30 MG 24 hr tablet Take 1 tablet (30 mg total) by mouth daily.   Marland Kitchen. losartan (COZAAR) 100 MG tablet Take 1 tablet (100 mg total) by mouth daily.   . metFORMIN (GLUCOPHAGE) 500 MG tablet Take 1 tablet by mouth each morning.   Marland Kitchen. albuterol (PROVENTIL HFA;VENTOLIN HFA) 108 (90 Base) MCG/ACT inhaler Inhale 2 puffs into the lungs every 6 (six) hours as needed for wheezing or shortness of breath. 09/04/2016: Twice a day   . Blood Pressure Monitoring (BLOOD PRESSURE CUFF) MISC 1 each by Does not  apply route daily.   . chlorthalidone (HYGROTON) 50 MG tablet Take 1 tablet (50 mg total) by mouth daily. 06/30/2016: Pt instructed to hold today 10/24, start back taking 10/25.   . cholecalciferol (VITAMIN D) 1000 UNITS tablet Take 1,000 Units by mouth daily. Reported on 03/06/2016   . gabapentin (NEURONTIN) 100 MG capsule Take 1 capsule 3 times a day.   . ibuprofen (ADVIL,MOTRIN) 600 MG tablet Take 600 mg by mouth every 6 (six) hours as needed. 07/28/2016: As needed.   . latanoprost (XALATAN) 0.005 % ophthalmic solution 1 drop at bedtime.   . timolol (BETIMOL) 0.5 % ophthalmic solution Place 1 drop into both eyes daily. 09/04/2016: Pt taking once a day   . triamcinolone cream (KENALOG) 0.1 % Apply 1 application topically 2 (two) times daily. Apply twice a day until clear.    No facility-administered encounter medications on file as of 11/27/2016.     Functional Status: In your present state of health, do you have any difficulty performing the following activities: 09/08/2016 04/14/2016  Hearing? N Y  Vision? N Y  Difficulty concentrating or making decisions? N Y  Walking or climbing stairs? N N  Dressing or bathing? N N  Doing errands, shopping? N Y  Quarry managerreparing Food and eating ? - Y  Using the Toilet? - N  In the past six months, have  you accidently leaked urine? - N  Do you have problems with loss of bowel control? - N  Managing your Medications? - N  Managing your Finances? - Y  Housekeeping or managing your Housekeeping? - N  Some recent data might be hidden    Fall/Depression Screening: PHQ 2/9 Scores 10/15/2016 08/17/2016 05/28/2016 04/16/2016 04/14/2016 08/06/2015 04/17/2015  PHQ - 2 Score 3 0 0 0 0 0 0  PHQ- 9 Score 15 - - - - - -    Assessment:  Pill planners reviewed---current pill planner showed good adherence---patient appears to have only missed Friday afternoon medications.   There were some errors/omissions to pill box:  -carvedilol 3.125 mg was still in pill box----PCP  had increased dose to 6.25 mg twice daily---pill boxes adjusted.  -some days were missing hydralazine three times daily---pill boxes adjusted to reflect three times daily dosing  -carvedilol only in pill box once daily on second box---pill box adjusted to reflect twice daily dosing -amitriptyline not in pill box---patient reported med was just refilled and needed to be added---pill box adjusted accordingly.    Medication management: Discussed option of blister packaging or delivery of medications from a local pharmacy---patient interested in this so will look into alternative packaging options for patient and discuss with patient.   Patient was out of chlorthalidone and agrees to get refill from his pharmacy.    Plan:  Will place follow-up call to patient in the next 2 weeks.   Will look into adherence packaging options of medications for patient as well.    [name], PharmD, [facility] Clinical Pharmacist [facility] [phone]

## 2016-12-07 ENCOUNTER — Other Ambulatory Visit: Payer: Self-pay | Admitting: Pharmacist

## 2016-12-07 NOTE — Patient Outreach (Signed)
[facility]) Care Management  12/07/2016  Juan King [birth year] [id number]  Successful phone follow-up with patient.  Patient reports his friend's [name]) sister is filling pill boxes for patient every two weeks.    He reports he is working to get established with [employer] which appears to be a [employer]  Patient states he is working to complete paperwork for this program.    Patient reports he would go to the doctor there and have a different pharmacy which would help manage his medications.    Had also looked into adherence packaging services from area pharmacies for patient.  Located two pharmacies in the area which offered blister packaging and/or pill box fills, but each pharmacy had a charge associated with these services---patient reports not interested in paying for these services at this time.   When asked if patient missed doses in the past week---he reports no.   Plan:  Will place follow-up call to patient in the next 2 weeks---if patient is established with [facility] PACE program, anticipate [facility] case closure.   [name], PharmD, [facility] Clinical Pharmacist [facility] [phone]

## 2016-12-21 ENCOUNTER — Other Ambulatory Visit: Payer: Self-pay | Admitting: Pharmacist

## 2016-12-21 NOTE — Patient Outreach (Signed)
[facility]) Care Management  12/21/2016  Juan King 1951/03/31 [id number]  Successful phone outreach to patient, HIPAA details verified.  Patient reports his friend's sister continues to fill his pill boxes for him.  Patient denies that he is missing medication when pill boxes are filled for him.    He reports he is working to see if he can get into [facility] program.  Patient did not have medication related questions or concerns.    Received an inbound call from a male named, Juan King, she reports she was with patient at time of her call.  Juan King was placed on the phone and gave verbal consent to discuss his medications with Juan King.    Juan King reports she is the person filling his pill boxes and reports she calls patient to remind him to take his medications.  In conversation with Juan King, she reports patient does not have any chlorthalidone as he apparently did not get his refill last month.    Juan King reports she is working with patient to attempt to get him in PACE program at [facility].   Discussed with Juan King there are pharmacies in patient's area who offer blister packaging of medication with a charge of $1-1.50/card and a pharmacy who does pill box fills for $5/box.    Plan:  At this time will place another follow-up call to patient in the next month to see if he qualified for PACE program.   Patient was counseled to get his refills and take medications as directed by his prescriber.    [name], PharmD, [facility] Clinical Pharmacist [facility] [phone]

## 2016-12-22 ENCOUNTER — Ambulatory Visit: Payer: Medicare HMO | Admitting: Family Medicine

## 2016-12-22 ENCOUNTER — Ambulatory Visit: Payer: Commercial Managed Care - HMO | Admitting: Family Medicine

## 2016-12-30 ENCOUNTER — Ambulatory Visit (INDEPENDENT_AMBULATORY_CARE_PROVIDER_SITE_OTHER): Payer: Medicare HMO | Admitting: Family Medicine

## 2016-12-30 ENCOUNTER — Encounter: Payer: Self-pay | Admitting: Family Medicine

## 2016-12-30 VITALS — BP 168/94 | HR 68 | Temp 98.3°F | Resp 16 | Ht 60.0 in | Wt 135.6 lb

## 2016-12-30 DIAGNOSIS — I10 Essential (primary) hypertension: Secondary | ICD-10-CM

## 2016-12-30 MED ORDER — HYDROCHLOROTHIAZIDE 25 MG PO TABS: 12.5000 mg | ORAL_TABLET | Freq: Every day | ORAL | 5 refills | Status: DC

## 2016-12-30 NOTE — Patient Instructions (Signed)
Thank you for coming in to clinic today.  1.  New BP pill Hydrochlorothiazide  tablet - start by CUTTING IN HALF for 12.5mg  DAILY - take at 9 to 10 AM  Take all other BP meds IN MORNING - Carvedilol - TWO times daily (morning and evening) - Losartan - morning only - Amlodipine - morning only - Hydralazine - THREE times daliy (morning, afternoon, and evening)  Keep checking BP regularly, daily for now after med change.  If feel dizzy on new medication and do not feel right, then can hold it and call our office, otherwise, if BP is not improved if still >160/90 consistently then we can INCREASE dose to one WHOLE tab  daily  Please schedule a follow-up appointment with Dr. Althea Charon in 3-6 weeks as needed for HTN  If you have any other questions or concerns, please feel free to call the clinic or send a message through MyChart. You may also schedule an earlier appointment if necessary.  Saralyn Pilar, DO [facility] [address], [place]

## 2016-12-30 NOTE — Progress Notes (Signed)
Subjective:    Patient ID: Juan King, male    DOB: May 08, 1951, 66 y.o.   MRN: [id number]  Juan King is a 66 y.o. male presenting on 12/30/2016 for Hypertension (Patient here today to fu on BP last ov was on 11/16/16 BP was 185/90. Patient reports checking BP at home and reports that readings have been elevated. Patient denies chest pain and edema. Patient reports good tolerance and compliance with medications. )  History primarily provided by patient's caregiver Carolyne Fiscal.  HPI   CHRONIC HTN: Reports chronic problem for long time with difficult to control BP, and chart review shows extensive elevated readings. Has detailed home BP log performed by caregiver, mostly elevated on avg 160/90, frequent  170/100s, and rarely 130-140/70-80s. In past chart review he was on Chlorthalidone , this was discontinued due to symptoms of dizziness, however he has had rare dizzy spells regardless, now has been doing well. Current Meds - Carvedilol 6.25mg  BID (was 3.125mg  previously, 1 month ago last visit increased), Amlodipine  daily, Hydralazine  TID, Imdur , Losartan  -  Has med bottles Reports good compliance, took meds today. Tolerating well, w/o complaints. Lifestyle - limited exercise Denies CP, dyspnea, HA, edema, dizziness / lightheadedness  Poor Dentition / Dental Decay - He has several teeth requiring dental extractions, some causing him pain and discomfort, his dentist told him that they would not perform dental extraction until BP was controlled   Social History  Substance Use Topics  . Smoking status: Never Smoker  . Smokeless tobacco: Never Used  . Alcohol use No    Review of Systems Per HPI unless specifically indicated above     Objective:    BP (!) 168/94 (BP Location: Right Arm, Cuff Size: Normal)   Pulse 68   Temp 98.3 F (36.8 C) (Oral)   Resp 16   Ht 5' (1.524 m)   Wt 135 lb 9.6 oz (61.5 kg)   SpO2 100%   BMI 26.48 kg/m   Wt  Readings from Last 3 Encounters:  12/30/16 135 lb 9.6 oz (61.5 kg)  11/16/16 131 lb (59.4 kg)  10/15/16 129 lb (58.5 kg)    Physical Exam  Constitutional: He appears well-developed and well-nourished. No distress.  Chronically ill but currently well-appearing, comfortable, cooperative, has cane for ambulation  HENT:  Head: Normocephalic and atraumatic.  Mouth/Throat: Oropharynx is clear and moist.  Poor dentition  Eyes: Conjunctivae are normal.  Neck: Normal range of motion. Neck supple.  Cardiovascular: Normal rate, regular rhythm, normal heart sounds and intact distal pulses.   No murmur heard. Pulmonary/Chest: Effort normal and breath sounds normal. No respiratory distress. He has no wheezes. He has no rales.  Musculoskeletal: He exhibits edema (Trace bilateral ankle and lower extremity edema non pitting).  Neurological: He is alert.  Skin: Skin is warm and dry. No rash noted. He is not diaphoretic. No erythema.  Scattered skin abrasions bilateral lower extremities  Psychiatric: He has a normal mood and affect. His behavior is normal.  Nursing note and vitals reviewed.    Results for orders placed or performed in visit on 10/15/16  HgB A1c  Result Value Ref Range   Hgb A1c MFr Bld 6.1 (H) <5.7 %   Mean Plasma Glucose 128 mg/dL      Assessment & Plan:   Problem List Items Addressed This Visit    Resistant hypertension - Primary    Difficult to control BP, persistent elevated readings at home and in  office, despite med changes. Polypharmacy already with 5 anti-HTN meds, in past had dizziness with some labile BPs on Chlorthalidone, now not on thiazide. - No known complications  Plan: 1. Discussion today on BP med regimen - decision to restart thiazide diuretic for better BP control. Will reduce potency to HCTZ 12.5mg  instead of prior Chlorthalidone. 2. Stagger BP meds take existing meds - Carvedilol, Amlodipine, Hydralazine, Losartan in AM 7-8am as usual, and add new med at  9-10am to reduce risk of dizziness 3. Stay well hydrated 4. Continue close monitoring BP, notify office if significant elevated >180/100, or low and symptomatic 5. Follow-up 3-6 weeks HTN, if needed consider expanding secondary HTN work-up, or referral to Cardiology / Nephrology      Relevant Medications   hydrochlorothiazide (HYDRODIURIL) 25 MG tablet      Meds ordered this encounter  Medications  . hydrochlorothiazide (HYDRODIURIL) 25 MG tablet    Sig: Take 0.5 tablets (12.5 mg total) by mouth daily. If BP not improved after 1-2 weeks can increase to whole tab    Dispense:  30 tablet    Refill:  5    Follow up plan: Return in about 3 weeks (around 01/20/2017) for blood pressure.  Saralyn Pilar, DO [facility] 12/31/2016, 6:34 AM

## 2016-12-31 ENCOUNTER — Encounter: Payer: Self-pay | Admitting: Family Medicine

## 2016-12-31 NOTE — Assessment & Plan Note (Addendum)
Difficult to control BP, persistent elevated readings at home and in office, despite med changes. Polypharmacy already with 5 anti-HTN meds, in past had dizziness with some labile BPs on Chlorthalidone, now not on thiazide. - No known complications  Plan: 1. Discussion today on BP med regimen - decision to restart thiazide diuretic for better BP control. Will reduce potency to HCTZ 12.5mg  instead of prior Chlorthalidone. 2. Stagger BP meds take existing meds - Carvedilol, Amlodipine, Hydralazine, Losartan in AM 7-8am as usual, and add new med at 9-10am to reduce risk of dizziness 3. Stay well hydrated 4. Continue close monitoring BP, notify office if significant elevated >180/100, or low and symptomatic 5. Follow-up 3-6 weeks HTN, if needed consider expanding secondary HTN work-up, or referral to Cardiology / Nephrology

## 2017-01-11 ENCOUNTER — Encounter: Payer: Self-pay | Admitting: Pharmacist

## 2017-01-11 ENCOUNTER — Other Ambulatory Visit: Payer: Self-pay | Admitting: Pharmacist

## 2017-01-11 NOTE — Patient Outreach (Signed)
[facility]) Care Management  01/11/2017  Juan King 1950/11/09 [id number]  Successful phone outreach to patient.  HIPAA details verified.  Patient reports he has been doing well taking his medications.  He reports he has friends who are helping to fill his pill boxes and he reports he was with Juan King, whom is one of the people helping to fill his pill box.  Patient provided verbal permission to speak with Juan King.    Juan King reports her or Juan King are filling patient's pill box.  She reports patient has 2 weeks of pill boxes filled.  Juan King and patient report he has not heard from [facility] program regarding eligibility yet.    Juan King reports patient is taking hydrochlorothiazide which PCP started at last office visit.  She asked about other ways for pill boxes to be filled for patient and was counseled there are pharmacies in patient's area who offer blister packaging of medications and pill box fills but there is a charge associated with these services and she reports patient is unable to pay this.   Patient declines having medication related questions/concerns and Juan King reports she or Juan King will continue filling patient's pill boxes.   Plan:  Close patient's case---goals met.   Patient reports he has [name] Pharmacist phone number if new pharmacy related needs arise.   Juan King, PharmD, [place] [phone]

## 2018-07-18 ENCOUNTER — Encounter: Payer: Self-pay | Admitting: *Deleted

## 2018-07-18 ENCOUNTER — Emergency Department
Admission: EM | Admit: 2018-07-18 | Discharge: 2018-07-19 | Disposition: A | Discharge status: Home / Self Care | Payer: Medicare (Managed Care) | Attending: Emergency Medicine | Admitting: Emergency Medicine

## 2018-07-18 ENCOUNTER — Other Ambulatory Visit: Payer: Self-pay

## 2018-07-18 DIAGNOSIS — E1140 Type 2 diabetes mellitus with diabetic neuropathy, unspecified: Secondary | ICD-10-CM | POA: Insufficient documentation

## 2018-07-18 DIAGNOSIS — R202 Paresthesia of skin: Secondary | ICD-10-CM | POA: Insufficient documentation

## 2018-07-18 DIAGNOSIS — Z79899 Other long term (current) drug therapy: Secondary | ICD-10-CM | POA: Insufficient documentation

## 2018-07-18 DIAGNOSIS — Z7982 Long term (current) use of aspirin: Secondary | ICD-10-CM | POA: Insufficient documentation

## 2018-07-18 DIAGNOSIS — Z7984 Long term (current) use of oral hypoglycemic drugs: Secondary | ICD-10-CM | POA: Insufficient documentation

## 2018-07-18 DIAGNOSIS — Z955 Presence of coronary angioplasty implant and graft: Secondary | ICD-10-CM | POA: Diagnosis not present

## 2018-07-18 DIAGNOSIS — R531 Weakness: Secondary | ICD-10-CM

## 2018-07-18 DIAGNOSIS — N390 Urinary tract infection, site not specified: Secondary | ICD-10-CM | POA: Diagnosis not present

## 2018-07-18 DIAGNOSIS — R42 Dizziness and giddiness: Secondary | ICD-10-CM | POA: Diagnosis present

## 2018-07-18 DIAGNOSIS — I10 Essential (primary) hypertension: Secondary | ICD-10-CM | POA: Diagnosis not present

## 2018-07-18 LAB — CBC
HCT: 43.4 % (ref 39.0–52.0)
HEMOGLOBIN: 14.3 g/dL (ref 13.0–17.0)
MCH: 31.3 pg (ref 26.0–34.0)
MCHC: 32.9 g/dL (ref 30.0–36.0)
MCV: 95 fL (ref 80.0–100.0)
Platelets: 263 10*3/uL (ref 150–400)
RBC: 4.57 MIL/uL (ref 4.22–5.81)
RDW: 13.1 % (ref 11.5–15.5)
WBC: 6.5 10*3/uL (ref 4.0–10.5)
nRBC: 0 % (ref 0.0–0.2)

## 2018-07-18 LAB — BASIC METABOLIC PANEL
Anion gap: 7 (ref 5–15)
BUN: 15 mg/dL (ref 8–23)
CO2: 22 mmol/L (ref 22–32)
CREATININE: 1.11 mg/dL (ref 0.61–1.24)
Calcium: 9.1 mg/dL (ref 8.9–10.3)
Chloride: 110 mmol/L (ref 98–111)
GFR calc Af Amer: >60 mL/min (ref >60)
Glucose, Bld: 181 mg/dL — ABNORMAL HIGH (ref 70–99)
POTASSIUM: 4.2 mmol/L (ref 3.5–5.1)
SODIUM: 139 mmol/L (ref 135–145)

## 2018-07-18 MED ORDER — SODIUM CHLORIDE 0.9 % IV BOLUS
1000.0000 mL | Freq: Once | INTRAVENOUS | Status: AC
  Administered 2018-07-18: 1000 mL via INTRAVENOUS

## 2018-07-18 NOTE — ED Provider Notes (Signed)
-----------------------------------------   11:34 PM on 07/18/2018 -----------------------------------------   Assuming care from Dr. Lenard Lance.  In short, Juan King is a 67 y.o. male with a chief complaint of weakness.  Refer to the original H&P for additional details.  The current plan of care is to follow-up on the urinalysis.  Anticipate discharge with or without antibiotics.  Patient has been hemodynamically stable and the patient and his family are comfortable with the plan.   ----------------------------------------- 12:35 AM on 07/19/2018 -----------------------------------------  Patient does appear to have urinary tract infection.  He still feels fine and is okay with going home.  I am giving a first dose of Keflex, have ordered a urine culture, and have prescribed Keflex as an outpatient.  I gave my usual and customary return precautions.    Loleta Rose, MD 07/19/18 [phone]
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
## 2018-07-18 NOTE — ED Triage Notes (Signed)
Pt states dizziness and numbness started after he took meds tonight. Pt started on doxazosin 1 mg last week. Pt states dizziness after taking meds since starting the doxazosin, but no numbness until tonight. Pt denies headache and is neurologically intact bilaterally, no new deficits other than generalized weakness.

## 2018-07-18 NOTE — Discharge Instructions (Addendum)
As we discussed please start taking half a Cardura tablet instead of a full tablet for the next 7 days, then you may increase to a full tablet.  Please drink plenty of fluids.  Please follow-up with your doctor tomorrow by calling to arrange a follow-up appointment as soon as possible.  Return to the emergency department for any further episodes of weakness, any chest pain, or any other symptom personally concerning to yourself.  Additionally, your urinalysis indicated that you have a urinary tract infection.  Please take the prescribed antibiotic for the full course of treatment and follow-up with your regular doctor to discuss whether or not you need additional testing to see if the infection has resolved.  Please tell your doctor that a urine culture has been sent.

## 2018-07-18 NOTE — ED Triage Notes (Signed)
Pt is a PACE pt. Pt c/o numbness in his extremities and dizziness. Lives alone at home. Pt has family involvement. Pt also c/o dental pain. CBG 151, DMII, uses a cane.

## 2018-07-18 NOTE — ED Notes (Signed)
Pt stated that since around 2000, he has been having numbness and tingling in his arms and legs and like he was going to pass out Pt stated that after he would rest for a little bit he would feel better. Pt was supposed to have some teeth pulled today but was unable to due to his BP being high. Pt also c/o leg pain.

## 2018-07-19 LAB — URINALYSIS, COMPLETE (UACMP) WITH MICROSCOPIC
Bilirubin Urine: NEGATIVE
Glucose, UA: 150 mg/dL — AB
Hgb urine dipstick: NEGATIVE
Ketones, ur: NEGATIVE mg/dL
Nitrite: NEGATIVE
Protein, ur: NEGATIVE mg/dL
Specific Gravity, Urine: 1.02 (ref 1.005–1.030)
WBC, UA: >50 WBC/hpf — ABNORMAL HIGH (ref 0–5)
pH: 6 (ref 5.0–8.0)

## 2018-07-19 MED ORDER — CEPHALEXIN 500 MG PO CAPS
500.0000 mg | ORAL_CAPSULE | Freq: Once | ORAL | Status: AC
  Administered 2018-07-19: 500 mg via ORAL
  Filled 2018-07-19: qty 1

## 2018-07-19 MED ORDER — CEPHALEXIN 500 MG PO CAPS: 500.0000 mg | ORAL_CAPSULE | Freq: Three times a day (TID) | ORAL | 0 refills | Status: DC

## 2018-07-21 ENCOUNTER — Other Ambulatory Visit: Payer: Self-pay | Admitting: Family Medicine

## 2018-07-21 LAB — URINE CULTURE
Culture: >=100000 — AB
Special Requests: NORMAL

## 2018-08-02 ENCOUNTER — Other Ambulatory Visit: Payer: Self-pay | Admitting: Family Medicine

## 2018-08-02 DIAGNOSIS — I10 Essential (primary) hypertension: Secondary | ICD-10-CM

## 2018-08-11 ENCOUNTER — Ambulatory Visit
Admission: RE | Admit: 2018-08-11 | Discharge: 2018-08-11 | Disposition: A | Discharge status: Home / Self Care | Payer: Medicare (Managed Care) | Source: Ambulatory Visit | Attending: Family Medicine | Admitting: Family Medicine

## 2018-08-11 DIAGNOSIS — I10 Essential (primary) hypertension: Secondary | ICD-10-CM | POA: Insufficient documentation

## 2018-09-08 ENCOUNTER — Emergency Department
Admission: EM | Admit: 2018-09-08 | Discharge: 2018-09-08 | Disposition: A | Discharge status: Home / Self Care | Payer: Medicare (Managed Care) | Attending: Emergency Medicine | Admitting: Emergency Medicine

## 2018-09-08 ENCOUNTER — Other Ambulatory Visit: Payer: Self-pay

## 2018-09-08 ENCOUNTER — Emergency Department: Payer: Medicare (Managed Care)

## 2018-09-08 DIAGNOSIS — M25562 Pain in left knee: Secondary | ICD-10-CM | POA: Insufficient documentation

## 2018-09-08 DIAGNOSIS — E119 Type 2 diabetes mellitus without complications: Secondary | ICD-10-CM | POA: Diagnosis not present

## 2018-09-08 DIAGNOSIS — W19XXXA Unspecified fall, initial encounter: Secondary | ICD-10-CM

## 2018-09-08 DIAGNOSIS — Z7982 Long term (current) use of aspirin: Secondary | ICD-10-CM | POA: Diagnosis not present

## 2018-09-08 DIAGNOSIS — M79642 Pain in left hand: Secondary | ICD-10-CM | POA: Diagnosis not present

## 2018-09-08 DIAGNOSIS — Z79899 Other long term (current) drug therapy: Secondary | ICD-10-CM | POA: Diagnosis not present

## 2018-09-08 MED ORDER — HYDROCODONE-ACETAMINOPHEN 5-325 MG PO TABS
1.0000 | ORAL_TABLET | Freq: Once | ORAL | Status: AC
  Administered 2018-09-08: 1 via ORAL
  Filled 2018-09-08: qty 1

## 2018-09-08 MED ORDER — HYDROCODONE-ACETAMINOPHEN 5-325 MG PO TABS: 1.0000 | ORAL_TABLET | Freq: Four times a day (QID) | ORAL | 0 refills | Status: DC | PRN

## 2018-09-08 NOTE — Discharge Instructions (Addendum)
1.  You may have a tendon injury of your knee.  Please wear Ace wrap while you are awake.  You may remove to bathe and sleep.  Please use your walker instead of your cane to help balance while you walk. 2.  You may take pain medicine as needed (Norco #15). 3.  Return to the ER for worsening symptoms, persistent vomiting, difficulty breathing or other concerns.

## 2018-09-08 NOTE — ED Triage Notes (Signed)
Pt stated that he got up to go the bathroom this morning and fell and is having pain to his left arm and leg. Pt has hx of CVA with some weakness/numbness is normal for him.
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
## 2018-09-09 ENCOUNTER — Ambulatory Visit
Admission: RE | Admit: 2018-09-09 | Discharge: 2018-09-09 | Disposition: A | Discharge status: Home / Self Care | Payer: Medicare (Managed Care) | Source: Ambulatory Visit | Attending: Family Medicine | Admitting: Family Medicine

## 2018-09-09 ENCOUNTER — Other Ambulatory Visit: Payer: Self-pay | Admitting: Family Medicine

## 2018-09-09 DIAGNOSIS — W19XXXS Unspecified fall, sequela: Secondary | ICD-10-CM

## 2018-09-09 DIAGNOSIS — S0093XD Contusion of unspecified part of head, subsequent encounter: Secondary | ICD-10-CM

## 2018-09-09 DIAGNOSIS — W19XXXA Unspecified fall, initial encounter: Secondary | ICD-10-CM

## 2018-09-09 DIAGNOSIS — G8112 Spastic hemiplegia affecting left dominant side: Secondary | ICD-10-CM | POA: Diagnosis not present

## 2018-09-09 DIAGNOSIS — M47812 Spondylosis without myelopathy or radiculopathy, cervical region: Secondary | ICD-10-CM | POA: Diagnosis not present

## 2018-09-20 ENCOUNTER — Other Ambulatory Visit: Payer: Self-pay | Admitting: Family Medicine

## 2018-09-20 DIAGNOSIS — R252 Cramp and spasm: Secondary | ICD-10-CM

## 2018-10-04 ENCOUNTER — Ambulatory Visit: Payer: Medicare (Managed Care)

## 2019-05-05 ENCOUNTER — Ambulatory Visit
Admission: RE | Admit: 2019-05-05 | Discharge: 2019-05-05 | Disposition: A | Discharge status: Home / Self Care | Payer: Medicare (Managed Care) | Source: Ambulatory Visit | Attending: Family Medicine | Admitting: Family Medicine

## 2019-05-05 ENCOUNTER — Other Ambulatory Visit: Payer: Self-pay

## 2019-05-15 IMAGING — DX DG HAND COMPLETE 3+V*L*
3 series · 3 of 3 positions shown · non-contrast
Comparison: None.

CLINICAL DATA: Fall

EXAM:
LEFT HAND - COMPLETE 3+ VIEW

[hand ap]
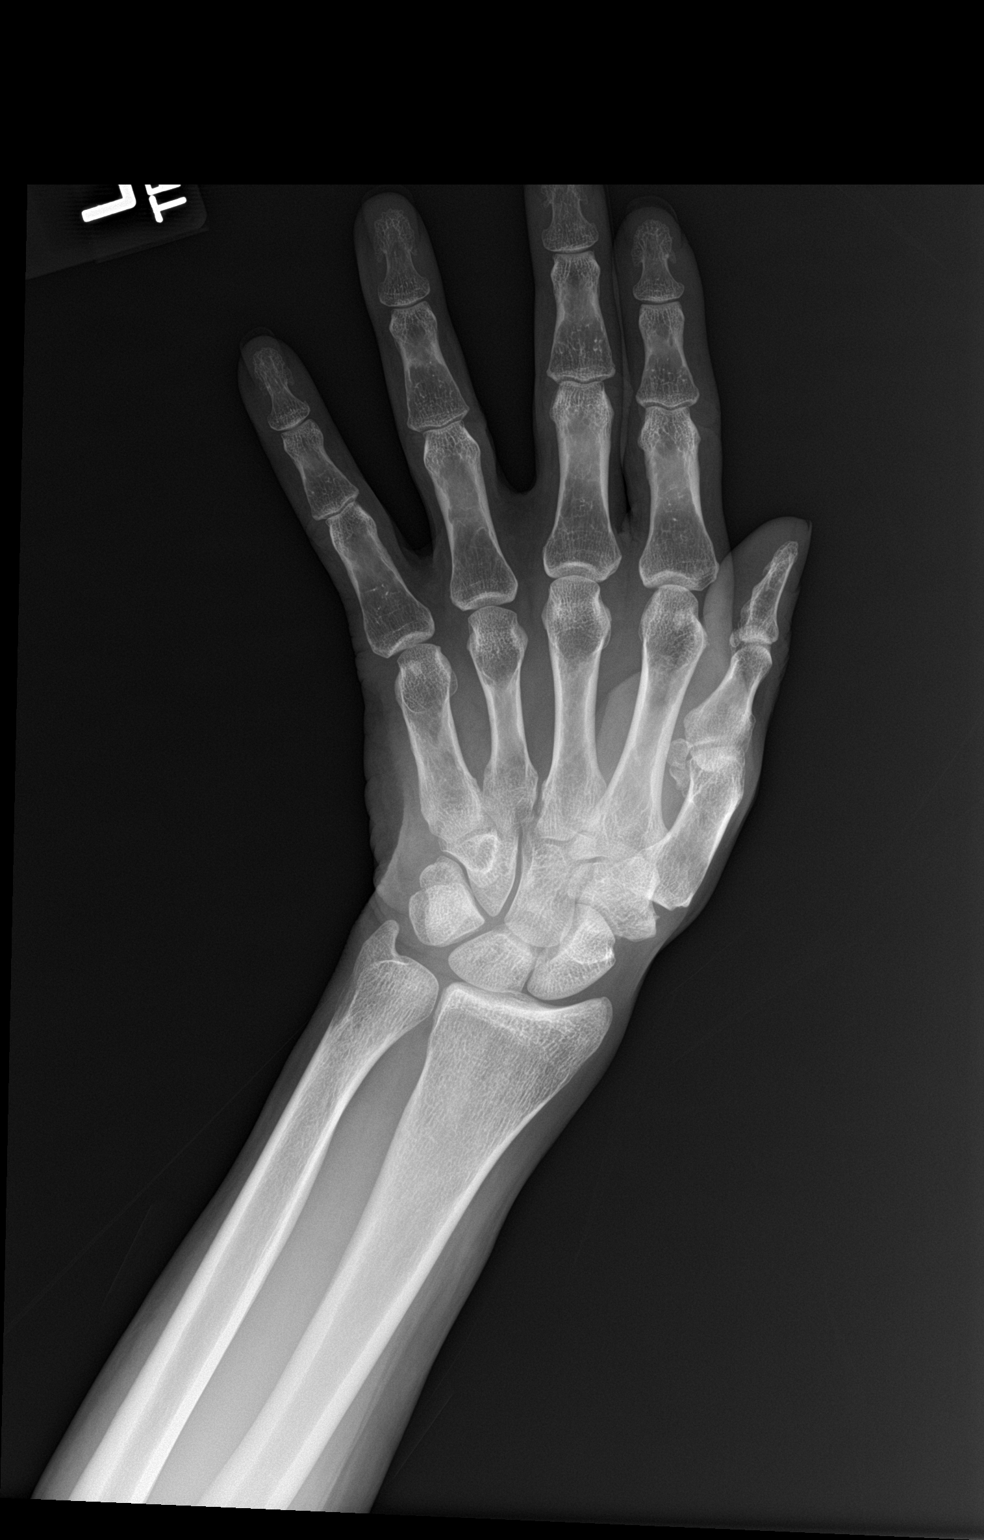

[hand obl]
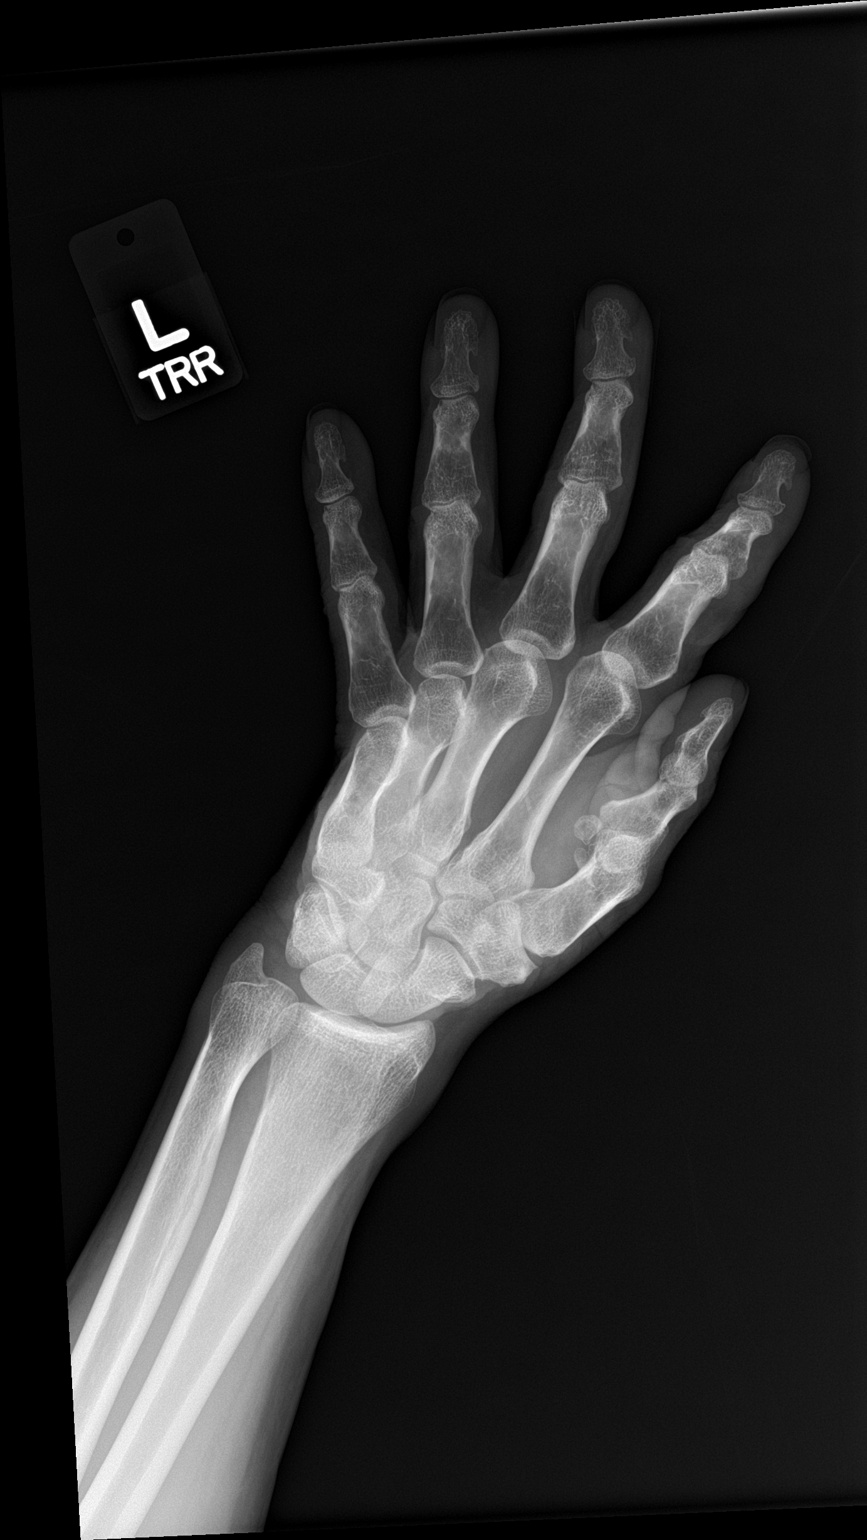

[hand lat]
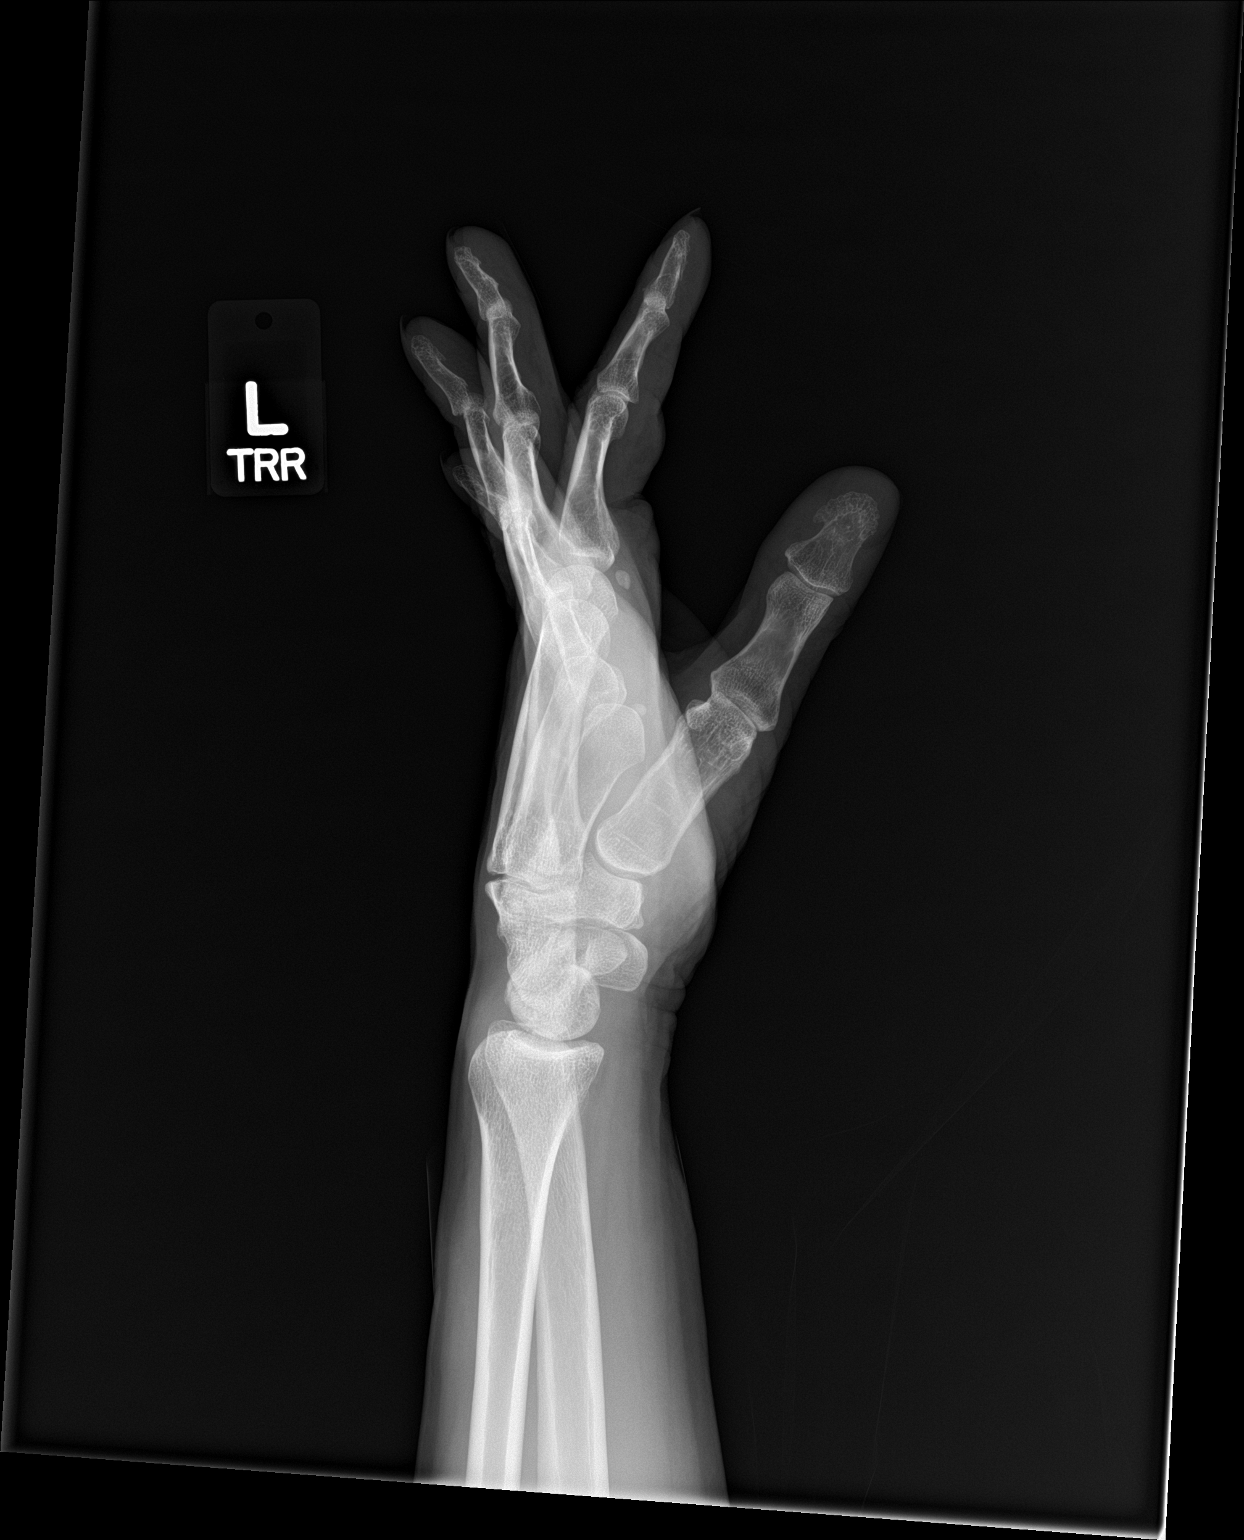

[3 of 3 positions shown; findings below may reference images not displayed]

FINDINGS: There is no evidence of fracture or dislocation. There is no
evidence of arthropathy or other focal bone abnormality. Soft
tissues are unremarkable.
IMPRESSION: No acute abnormality.

## 2020-02-11 ENCOUNTER — Emergency Department: Payer: Medicare (Managed Care)

## 2020-02-11 ENCOUNTER — Other Ambulatory Visit: Payer: Self-pay

## 2020-02-11 ENCOUNTER — Observation Stay
Admission: EM | Admit: 2020-02-11 | Discharge: 2020-02-12 | Disposition: A | Discharge status: Home / Self Care | Payer: Medicare (Managed Care) | Attending: Internal Medicine | Admitting: Internal Medicine

## 2020-02-11 DIAGNOSIS — I69398 Other sequelae of cerebral infarction: Secondary | ICD-10-CM | POA: Diagnosis not present

## 2020-02-11 DIAGNOSIS — E1140 Type 2 diabetes mellitus with diabetic neuropathy, unspecified: Secondary | ICD-10-CM | POA: Diagnosis not present

## 2020-02-11 DIAGNOSIS — Z7982 Long term (current) use of aspirin: Secondary | ICD-10-CM | POA: Diagnosis not present

## 2020-02-11 DIAGNOSIS — Z79899 Other long term (current) drug therapy: Secondary | ICD-10-CM | POA: Diagnosis not present

## 2020-02-11 DIAGNOSIS — I959 Hypotension, unspecified: Secondary | ICD-10-CM | POA: Diagnosis not present

## 2020-02-11 DIAGNOSIS — M21372 Foot drop, left foot: Secondary | ICD-10-CM | POA: Diagnosis not present

## 2020-02-11 DIAGNOSIS — R531 Weakness: Secondary | ICD-10-CM | POA: Insufficient documentation

## 2020-02-11 DIAGNOSIS — R55 Syncope and collapse: Secondary | ICD-10-CM | POA: Diagnosis not present

## 2020-02-11 DIAGNOSIS — Z955 Presence of coronary angioplasty implant and graft: Secondary | ICD-10-CM | POA: Diagnosis not present

## 2020-02-11 DIAGNOSIS — E1165 Type 2 diabetes mellitus with hyperglycemia: Secondary | ICD-10-CM | POA: Insufficient documentation

## 2020-02-11 DIAGNOSIS — I10 Essential (primary) hypertension: Secondary | ICD-10-CM | POA: Insufficient documentation

## 2020-02-11 DIAGNOSIS — E785 Hyperlipidemia, unspecified: Secondary | ICD-10-CM | POA: Insufficient documentation

## 2020-02-11 DIAGNOSIS — I2510 Atherosclerotic heart disease of native coronary artery without angina pectoris: Secondary | ICD-10-CM | POA: Insufficient documentation

## 2020-02-11 DIAGNOSIS — F329 Major depressive disorder, single episode, unspecified: Secondary | ICD-10-CM | POA: Diagnosis not present

## 2020-02-11 DIAGNOSIS — Z993 Dependence on wheelchair: Secondary | ICD-10-CM | POA: Diagnosis not present

## 2020-02-11 LAB — DIFFERENTIAL
Abs Immature Granulocytes: 0.07 10*3/uL (ref 0.00–0.07)
Basophils Absolute: 0 10*3/uL (ref 0.0–0.1)
Basophils Relative: 0 %
Eosinophils Absolute: 0.1 10*3/uL (ref 0.0–0.5)
Eosinophils Relative: 1 %
Immature Granulocytes: 1 %
Lymphocytes Relative: 26 %
Lymphs Abs: 1.8 10*3/uL (ref 0.7–4.0)
Monocytes Absolute: 0.7 10*3/uL (ref 0.1–1.0)
Monocytes Relative: 9 %
Neutro Abs: 4.5 10*3/uL (ref 1.7–7.7)
Neutrophils Relative %: 63 %

## 2020-02-11 LAB — CBC
HCT: 40 % (ref 39.0–52.0)
HCT: 40.2 % (ref 39.0–52.0)
Hemoglobin: 13.5 g/dL (ref 13.0–17.0)
Hemoglobin: 13.5 g/dL (ref 13.0–17.0)
MCH: 31.5 pg (ref 26.0–34.0)
MCH: 31.6 pg (ref 26.0–34.0)
MCHC: 33.6 g/dL (ref 30.0–36.0)
MCHC: 33.8 g/dL (ref 30.0–36.0)
MCV: 93.7 fL (ref 80.0–100.0)
MCV: 93.9 fL (ref 80.0–100.0)
Platelets: 299 10*3/uL (ref 150–400)
Platelets: 307 10*3/uL (ref 150–400)
RBC: 4.27 MIL/uL (ref 4.22–5.81)
RBC: 4.28 MIL/uL (ref 4.22–5.81)
RDW: 13.8 % (ref 11.5–15.5)
RDW: 13.8 % (ref 11.5–15.5)
WBC: 7.1 10*3/uL (ref 4.0–10.5)
WBC: 7.1 10*3/uL (ref 4.0–10.5)
nRBC: 0 % (ref 0.0–0.2)
nRBC: 0 % (ref 0.0–0.2)

## 2020-02-11 LAB — HEPATIC FUNCTION PANEL
ALT: 20 U/L (ref 0–44)
AST: 28 U/L (ref 15–41)
Albumin: 3.3 g/dL — ABNORMAL LOW (ref 3.5–5.0)
Alkaline Phosphatase: 76 U/L (ref 38–126)
Bilirubin, Direct: 0.4 mg/dL — ABNORMAL HIGH (ref 0.0–0.2)
Indirect Bilirubin: 1.1 mg/dL — ABNORMAL HIGH (ref 0.3–0.9)
Total Bilirubin: 1.5 mg/dL — ABNORMAL HIGH (ref 0.3–1.2)
Total Protein: 6.7 g/dL (ref 6.5–8.1)

## 2020-02-11 LAB — BASIC METABOLIC PANEL
Anion gap: 8 (ref 5–15)
BUN: 14 mg/dL (ref 8–23)
CO2: 21 mmol/L — ABNORMAL LOW (ref 22–32)
Calcium: 9 mg/dL (ref 8.9–10.3)
Chloride: 105 mmol/L (ref 98–111)
Creatinine, Ser: 1.3 mg/dL — ABNORMAL HIGH (ref 0.61–1.24)
GFR calc Af Amer: >60 mL/min (ref >60)
GFR calc non Af Amer: 55 mL/min — ABNORMAL LOW (ref >60)
Glucose, Bld: 286 mg/dL — ABNORMAL HIGH (ref 70–99)
Potassium: 4.9 mmol/L (ref 3.5–5.1)
Sodium: 134 mmol/L — ABNORMAL LOW (ref 135–145)

## 2020-02-11 LAB — BRAIN NATRIURETIC PEPTIDE: B Natriuretic Peptide: 36.2 pg/mL (ref 0.0–100.0)

## 2020-02-11 LAB — TROPONIN I (HIGH SENSITIVITY)
Troponin I (High Sensitivity): 4 ng/L
Troponin I (High Sensitivity): 4 ng/L

## 2020-02-11 MED ORDER — SODIUM CHLORIDE 0.9 % IV BOLUS
500.0000 mL | Freq: Once | INTRAVENOUS | Status: AC
  Administered 2020-02-11: 500 mL via INTRAVENOUS

## 2020-02-11 NOTE — ED Triage Notes (Signed)
Pt states was getting ready to have a bowel movement when he fainted. Pt states he became dizzy. Per ems fsbs 305, pt alert and oriented x4.
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
## 2020-02-11 NOTE — ED Notes (Signed)
Pt has two patches on chest wall. Per caregiver patches are for high blood pressure. Caregiver and pt do not know name of medication, and patches are covered with flesh covered tape and cannot be separated from tape to visualize medication dose and name. Patches removed, md notified.

## 2020-02-11 NOTE — ED Notes (Signed)
Pt updated on admission process. Pt verbalizes understanding.  

## 2020-02-11 NOTE — H&P (Signed)
TRH H&P    Patient Demographics:    Juan King, is a 69 y.o. male  MRN: [id number]  DOB - 1951/09/02  Admit Date - 02/11/2020  Referring MD/NP/PA: Dr. Cinda Quest  Outpatient Primary MD for the patient is Gareth Morgan, MD  Patient coming from: Home with caregiver  Chief complaint- Dizziness   HPI:    Juan King  is a 69 y.o. 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 Patient defers history to caregiver. Caregiver reports that patient was sitting on commode when he got lightheaded and dizzy and fell over to the side but was caught by caregiver. Patient did not hit his head. Patient did not lose consciousness. Patient himself reports that he remembers the event, and he had associated tunnel vision. He agrees that he did not lose consciousness. He had been in his normal state of health for the day. He had no preceding factors including no chest pain, palpitations, shortness of breath. He reports that he just got weak and lightheaded. This is not happened to him before. Of note patient does have residual numbness of left leg. He has residual foot drop of left leg. Patient ambulates with a wheelchair. [facility]. Patient has no other complaints at this time.  ED highlights Temperature 98.1, pulse 54, respiratory rate 15, blood pressure 111/72 99% at 10-day White blood cell count 7.1, CHEM panel is reassuring with creatinine is slightly elevated 1.3 from baseline of 1.1. Hyperglycemia of 286 Slightly elevated total bili of 1.5 BNP 36 Troponin 4, 4 Chest x-ray shows no active disease  Report is the EMS found patient hypotensive with sugar in the 300s patient was given fluids. Patient had  2 clonidine patches on his chest that were covered with flash filling take. He is prescribed to wear 2 patches of clonidine 0.2 each. These patches have been removed in the ED and his blood pressure is improved since.   Review of systems:    In addition to the HPI above,  No Fever-chills, No Headache, No changes with Vision or hearing, No problems swallowing food or Liquids, No Chest pain, Cough or Shortness of Breath, No Abdominal pain, No Nausea or Vomiting, bowel movements are regular, No Blood in stool or Urine, No dysuria, Patient does report a Band-Aid over his hemorrhoids. He is unable to state if his hemorrhoids are actually acting up at this time. No new joints pains-aches,  No new weakness, tingling, numbness in any extremity, No recent weight gain or loss, No polyuria, polydypsia or polyphagia, No significant Mental Stressors.  All other systems reviewed and are negative.    Past History of the following :    Past Medical History:  Diagnosis Date  . Allergic rhinitis   . Coronary arteriosclerosis   . Diabetes mellitus type 2, controlled ([place])   . Hyperlipidemia   . Hypertension   .  Neuropathy of both feet   . Personal history of fall   . Psoriasis   . Seasonal allergies       Past Surgical History:  Procedure Laterality Date  . CORONARY STENT PLACEMENT        Social History:      Social History   Tobacco Use  . Smoking status: Never Smoker  . Smokeless tobacco: Never Used  Substance Use Topics  . Alcohol use: No    Alcohol/week: 0.0 standard drinks       Family History :     Family History  Problem Relation Age of Onset  . Diabetes Mother   . Hypertension Mother   . Diabetes Father   . Vision loss Father   . Hypertension Father   . Diabetes Sister       Home Medications:   Prior to Admission medications   Medication Sig Start Date End Date Taking? Authorizing Provider  albuterol (PROVENTIL HFA;VENTOLIN HFA) 108 (90 Base) MCG/ACT  inhaler Inhale 2 puffs into the lungs every 6 (six) hours as needed for wheezing or shortness of breath. 05/29/16  Yes Janeann Forehand., MD  aspirin EC 81 MG tablet Take 81 mg by mouth daily. Reported on 03/06/2016   Yes [provider]  atorvastatin (LIPITOR) 80 MG tablet Take 1 tablet (80 mg total) by mouth daily. 04/16/16  Yes Janeann Forehand., MD  carvedilol (COREG CR) 40 MG 24 hr capsule Take 40 mg by mouth daily.   Yes [provider]  cholecalciferol (VITAMIN D3) 25 MCG (1000 UNIT) tablet Take 1,000 Units by mouth daily.   Yes [provider]  cloNIDine (CATAPRES - DOSED IN MG/24 HR) 0.2 mg/24hr patch Place 0.2 mg onto the skin once a week. Wears 2 patches. Applied on weds   Yes [provider]  doxazosin (CARDURA) 2 MG tablet Take 2 mg by mouth daily.   Yes [provider]  folic acid (FOLVITE) 1 MG tablet Take 1 mg by mouth daily.   Yes [provider]  gabapentin (NEURONTIN) 100 MG capsule Take 1 capsule 3 times a day. Patient taking differently: Take 300 mg by mouth every evening. Take 1 capsule 3 times a day. 04/16/16  Yes Janeann Forehand., MD  hydrocortisone acetate 0.5 % cream Apply 1 application topically 2 (two) times daily.   Yes [provider]  isosorbide mononitrate (IMDUR) 30 MG 24 hr tablet Take 1 tablet (30 mg total) by mouth daily. 08/13/16  Yes Janeann Forehand., MD  latanoprost (XALATAN) 0.005 % ophthalmic solution Place 1 drop into both eyes at bedtime.   Yes [provider]  methotrexate (RHEUMATREX) 2.5 MG tablet Take 15 mg by mouth once a week. Caution:Chemotherapy. Protect from light.   Yes [provider]  psyllium (METAMUCIL SMOOTH TEXTURE) 28 % packet Take 1 packet by mouth 2 (two) times daily.   Yes [provider]  sertraline (ZOLOFT) 50 MG tablet Take 50 mg by mouth daily.   Yes [provider]  tiZANidine (ZANAFLEX) 2 MG tablet Take 2 mg by mouth every 6  (six) hours as needed for muscle spasms.   Yes [provider]  triamcinolone cream (KENALOG) 0.1 % Apply 1 application topically 2 (two) times daily. Apply twice a day until clear.   Yes [provider]  amLODipine (NORVASC) 10 MG tablet Take 1 tablet (10 mg total) by mouth daily. Patient taking differently: Take 7.5 mg by mouth  daily.  08/17/16   Janeann Forehand., MD  carvedilol (COREG) 6.25 MG tablet Take 1 tablet (6.25 mg total) by mouth 2 (two) times daily with a meal. 11/16/16   Janeann Forehand., MD     Allergies:    No Known Allergies   Physical Exam:   Vitals  Blood pressure 112/72, pulse (!) 54, temperature 98.1 F (36.7 C), temperature source Oral, resp. rate 15, height 5\' 4"  (1.626 m), weight 54.4 kg, SpO2 99 %.  1.  General: Lying supine in bed in no acute distress 2. Psychiatric: Mood and behavior are normal  3. Neurologic: At baseline with residual left-sided weakness in the upper and lower extremity   4. HEENMT:  Head is atraumatic, normocephalic, pupils are reactive to light, left eye has skin growth over sclera that is chronic, trachea midline  5. Respiratory : Lungs are clear to auscultation bilaterally  6. Cardiovascular : Heart rate is bradycardic rhythm is regular  7. Gastrointestinal:  Abdomen is soft, nondistended, nontender to palpation  8. Skin:  No acute lesions on limited skin exam  9.Musculoskeletal:  Brace on left leg for foot drop, no peripheral edema    Data Review:    CBC Recent Labs  Lab 02/11/20 2054  WBC 7.1  7.1  HGB 13.5  13.5  HCT 40.2  40.0  PLT 307  299  MCV 93.9  93.7  MCH 31.5  31.6  MCHC 33.6  33.8  RDW 13.8  13.8  LYMPHSABS 1.8  MONOABS 0.7  EOSABS 0.1  BASOSABS 0.0   ------------------------------------------------------------------------------------------------------------------  Results for orders placed or performed during the hospital encounter of 02/11/20 (from the past  48 hour(s))  Basic metabolic panel     Status: Abnormal   Collection Time: 02/11/20  8:54 PM  Result Value Ref Range   Sodium 134 (L) 135 - 145 mmol/L   Potassium 4.9 3.5 - 5.1 mmol/L    Comment: HEMOLYSIS AT THIS LEVEL MAY AFFECT RESULT   Chloride 105 98 - 111 mmol/L   CO2 21 (L) 22 - 32 mmol/L   Glucose, Bld 286 (H) 70 - 99 mg/dL    Comment: Glucose reference range applies only to samples taken after fasting for at least 8 hours.   BUN 14 8 - 23 mg/dL   Creatinine, Ser 04/12/20 (H) 0.61 - 1.24 mg/dL   Calcium 9.0 8.9 - 4.26 mg/dL   GFR calc non Af Amer 55 (L) >60 mL/min   GFR calc Af Amer >60 >60 mL/min   Anion gap 8 5 - 15    Comment: Performed at [facility], [address] [place], [place]  CBC     Status: None   Collection Time: 02/11/20  8:54 PM  Result Value Ref Range   WBC 7.1 4.0 - 10.5 K/uL   RBC 4.27 4.22 - 5.81 MIL/uL   Hemoglobin 13.5 13.0 - 17.0 g/dL   HCT 04/12/20 29.7 - 98.9 %   MCV 93.7 80.0 - 100.0 fL   MCH 31.6 26.0 - 34.0 pg   MCHC 33.8 30.0 - 36.0 g/dL   RDW 21.1 94.1 - 74.0 %   Platelets 299 150 - 400 K/uL   nRBC 0.0 0.0 - 0.2 %    Comment: Performed at [facility], [address] [place], [place]  Brain natriuretic peptide     Status: None   Collection Time: 02/11/20  8:54 PM  Result Value Ref Range  B Natriuretic Peptide 36.2 0.0 - 100.0 pg/mL    Comment: Performed at [facility], [address] [place], [place] [address]  Troponin I (High Sensitivity)     Status: None   Collection Time: 02/11/20  8:54 PM  Result Value Ref Range   Troponin I (High Sensitivity) 4 <18 ng/L    Comment: (NOTE) Elevated high sensitivity troponin I (hsTnI) values and significant  changes across serial measurements may suggest ACS but many other  chronic and acute conditions are known to elevate hsTnI results.  Refer to the "Links" section for chest pain algorithms and additional  guidance. Performed at [facility], [address] [place], [place] [address]   Hepatic function panel     Status: Abnormal   Collection Time: 02/11/20  8:54 PM  Result Value Ref Range   Total Protein 6.7 6.5 - 8.1 g/dL   Albumin 3.3 (L) 3.5 - 5.0 g/dL   AST 28 15 - 41 U/L    Comment: HEMOLYSIS AT THIS LEVEL MAY AFFECT RESULT   ALT 20 0 - 44 U/L    Comment: HEMOLYSIS AT THIS LEVEL MAY AFFECT RESULT   Alkaline Phosphatase 76 38 - 126 U/L   Total Bilirubin 1.5 (H) 0.3 - 1.2 mg/dL    Comment: HEMOLYSIS AT THIS LEVEL MAY AFFECT RESULT   Bilirubin, Direct 0.4 (H) 0.0 - 0.2 mg/dL    Comment: HEMOLYSIS AT THIS LEVEL MAY AFFECT RESULT   Indirect Bilirubin 1.1 (H) 0.3 - 0.9 mg/dL    Comment: Performed at [facility], [address] [place], [place] [address]  Differential     Status: None   Collection Time: 02/11/20  8:54 PM  Result Value Ref Range   Neutrophils Relative % 63 %   Neutro Abs 4.5 1.7 - 7.7 K/uL   Lymphocytes Relative 26 %   Lymphs Abs 1.8 0.7 - 4.0 K/uL   Monocytes Relative 9 %   Monocytes Absolute 0.7 0.1 - 1.0 K/uL   Eosinophils Relative 1 %   Eosinophils Absolute 0.1 0.0 - 0.5 K/uL   Basophils Relative 0 %   Basophils Absolute 0.0 0.0 - 0.1 K/uL   Immature Granulocytes 1 %   Abs Immature Granulocytes 0.07 0.00 - 0.07 K/uL    Comment: Performed at [facility], [address] [place], [place] [address]  CBC     Status: None   Collection Time: 02/11/20  8:54 PM  Result Value Ref Range   WBC 7.1 4.0 - 10.5 K/uL   RBC 4.28 4.22 - 5.81 MIL/uL   Hemoglobin 13.5 13.0 - 17.0 g/dL   HCT 71.1 65.7 - 90.3 %   MCV 93.9 80.0 - 100.0 fL   MCH 31.5 26.0 - 34.0 pg   MCHC 33.6 30.0 - 36.0 g/dL   RDW 83.3 38.3 - 29.1 %   Platelets 307 150 - 400 K/uL   nRBC 0.0 0.0 - 0.2 %    Comment: Performed at [facility], [address] [place], [place] [address]  Troponin I (High Sensitivity)     Status: None   Collection Time: 02/11/20 10:42 PM  Result Value Ref Range   Troponin  I (High Sensitivity) 4 <18 ng/L    Comment: (NOTE) Elevated high sensitivity troponin I (hsTnI) values and significant  changes across serial measurements may suggest ACS but many other  chronic and acute conditions are known to elevate hsTnI results.  Refer to the "Links" section for chest  pain algorithms and additional  guidance. Performed at [facility], [address] [place], [place] [address]     Chemistries  Recent Labs  Lab 02/11/20 2054  NA 134*  K 4.9  CL 105  CO2 21*  GLUCOSE 286*  BUN 14  CREATININE 1.30*  CALCIUM 9.0  AST 28  ALT 20  ALKPHOS 76  BILITOT 1.5*   ------------------------------------------------------------------------------------------------------------------  ------------------------------------------------------------------------------------------------------------------ GFR: Estimated Creatinine Clearance: 40.7 mL/min (A) (by C-G formula based on SCr of 1.3 mg/dL (H)). Liver Function Tests: Recent Labs  Lab 02/11/20 2054  AST 28  ALT 20  ALKPHOS 76  BILITOT 1.5*  PROT 6.7  ALBUMIN 3.3*   No results for input(s): LIPASE, AMYLASE in the last 168 hours. No results for input(s): AMMONIA in the last 168 hours. Coagulation Profile: No results for input(s): INR, PROTIME in the last 168 hours. Cardiac Enzymes: No results for input(s): CKTOTAL, CKMB, CKMBINDEX, TROPONINI in the last 168 hours. BNP (last 3 results) No results for input(s): PROBNP in the last 8760 hours. HbA1C: No results for input(s): HGBA1C in the last 72 hours. CBG: No results for input(s): GLUCAP in the last 168 hours. Lipid Profile: No results for input(s): CHOL, HDL, LDLCALC, TRIG, CHOLHDL, LDLDIRECT in the last 72 hours. Thyroid Function Tests: No results for input(s): TSH, T4TOTAL, FREET4, T3FREE, THYROIDAB in the last 72 hours. Anemia Panel: No results for input(s): VITAMINB12, FOLATE, FERRITIN, TIBC, IRON, RETICCTPCT in the last 72  hours.  --------------------------------------------------------------------------------------------------------------- Urine analysis:    Component Value Date/Time   COLORURINE YELLOW (A) 07/19/2018 0018   APPEARANCEUR HAZY (A) 07/19/2018 0018   APPEARANCEUR Hazy 07/10/2013 1824   LABSPEC 1.020 07/19/2018 0018   LABSPEC 1.011 07/10/2013 1824   PHURINE 6.0 07/19/2018 0018   GLUCOSEU 150 (A) 07/19/2018 0018   GLUCOSEU 150 mg/dL 56/21/3086 5784   HGBUR NEGATIVE 07/19/2018 0018   BILIRUBINUR NEGATIVE 07/19/2018 0018   BILIRUBINUR Negative 07/10/2013 1824   KETONESUR NEGATIVE 07/19/2018 0018   PROTEINUR NEGATIVE 07/19/2018 0018   NITRITE NEGATIVE 07/19/2018 0018   LEUKOCYTESUR SMALL (A) 07/19/2018 0018   LEUKOCYTESUR Negative 07/10/2013 1824      Imaging Results:    DG Chest Portable 1 View  Result Date: 02/11/2020 CLINICAL DATA:  Syncope. EXAM: PORTABLE CHEST 1 VIEW COMPARISON:  October 02, 2012 FINDINGS: There is no evidence of acute infiltrate, pleural effusion or pneumothorax. The heart size and mediastinal contours are within normal limits. The visualized skeletal structures are unremarkable. IMPRESSION: No active disease. Electronically Signed   By: Aram Candela M.D.   On: 02/11/2020 21:33   EKG shows sinus rhythm with rate of 64 no ischemic changes   Assessment & Plan:    Active Problems:   Syncope   1. Near syncope 1. Vasovagal versus orthostatic versus symptomatic bradycardia 2. Most likely vasovagal given history 3. Monitor on telemetry 4. Continue fluids 5. Hold Coreg 6. Echo and carotid Doppler in the a.m. 7. Continue to monitor 2. Hypotension 1. Hold antihypertensives 2. Continue fluids 3. Blood pressure improving appropriately 3. CAD 1. Continue home meds except for Coreg 4. Hyperglycemia in the setting of diabetes mellitus 1. Sliding scale   DVT Prophylaxis-   Heparin and SCDs  AM Labs Ordered, also please review Full Orders  Family  Communication: Admission, patients condition and plan of care including tests being ordered have been discussed with the patient and caregiver who indicate understanding and agree with the plan.  Code Status:  full  Admission status:  Observation      Time spent in minutes : 62   Ninetta Adelstein B Zierle-Ghosh DO

## 2020-02-12 DIAGNOSIS — R55 Syncope and collapse: Secondary | ICD-10-CM | POA: Diagnosis not present

## 2020-02-12 LAB — CBC WITH DIFFERENTIAL/PLATELET
Abs Immature Granulocytes: 0.06 10*3/uL (ref 0.00–0.07)
Basophils Absolute: 0 10*3/uL (ref 0.0–0.1)
Basophils Relative: 0 %
Eosinophils Absolute: 0 10*3/uL (ref 0.0–0.5)
Eosinophils Relative: 1 %
HCT: 37.9 % — ABNORMAL LOW (ref 39.0–52.0)
Hemoglobin: 13 g/dL (ref 13.0–17.0)
Immature Granulocytes: 1 %
Lymphocytes Relative: 24 %
Lymphs Abs: 1.8 10*3/uL (ref 0.7–4.0)
MCH: 31.4 pg (ref 26.0–34.0)
MCHC: 34.3 g/dL (ref 30.0–36.0)
MCV: 91.5 fL (ref 80.0–100.0)
Monocytes Absolute: 0.6 10*3/uL (ref 0.1–1.0)
Monocytes Relative: 8 %
Neutro Abs: 4.8 10*3/uL (ref 1.7–7.7)
Neutrophils Relative %: 66 %
Platelets: 295 10*3/uL (ref 150–400)
RBC: 4.14 MIL/uL — ABNORMAL LOW (ref 4.22–5.81)
RDW: 13.8 % (ref 11.5–15.5)
WBC: 7.2 10*3/uL (ref 4.0–10.5)
nRBC: 0 % (ref 0.0–0.2)

## 2020-02-12 LAB — HEMOGLOBIN A1C
Hgb A1c MFr Bld: 11.4 % — ABNORMAL HIGH (ref 4.8–5.6)
Mean Plasma Glucose: 280.48 mg/dL

## 2020-02-12 LAB — COMPREHENSIVE METABOLIC PANEL
ALT: 21 U/L (ref 0–44)
AST: 16 U/L (ref 15–41)
Albumin: 3.2 g/dL — ABNORMAL LOW (ref 3.5–5.0)
Alkaline Phosphatase: 75 U/L (ref 38–126)
Anion gap: 8 (ref 5–15)
BUN: 17 mg/dL (ref 8–23)
CO2: 24 mmol/L (ref 22–32)
Calcium: 8.9 mg/dL (ref 8.9–10.3)
Chloride: 107 mmol/L (ref 98–111)
Creatinine, Ser: 0.87 mg/dL (ref 0.61–1.24)
GFR calc Af Amer: >60 mL/min (ref >60)
GFR calc non Af Amer: >60 mL/min (ref >60)
Glucose, Bld: 237 mg/dL — ABNORMAL HIGH (ref 70–99)
Potassium: 4 mmol/L (ref 3.5–5.1)
Sodium: 139 mmol/L (ref 135–145)
Total Bilirubin: 0.9 mg/dL (ref 0.3–1.2)
Total Protein: 6.4 g/dL — ABNORMAL LOW (ref 6.5–8.1)

## 2020-02-12 LAB — HIV ANTIBODY (ROUTINE TESTING W REFLEX): HIV Screen 4th Generation wRfx: NONREACTIVE

## 2020-02-12 LAB — MAGNESIUM: Magnesium: 1.8 mg/dL (ref 1.7–2.4)

## 2020-02-12 LAB — GLUCOSE, CAPILLARY
Glucose-Capillary: 203 mg/dL — ABNORMAL HIGH (ref 70–99)
Glucose-Capillary: 317 mg/dL — ABNORMAL HIGH (ref 70–99)

## 2020-02-12 LAB — TSH: TSH: 0.488 u[IU]/mL (ref 0.350–4.500)

## 2020-02-12 MED ORDER — FOLIC ACID 1 MG PO TABS
1.0000 mg | ORAL_TABLET | Freq: Every day | ORAL | Status: DC
  Administered 2020-02-12: 1 mg via ORAL
  Filled 2020-02-12: qty 1

## 2020-02-12 MED ORDER — ONDANSETRON HCL 4 MG PO TABS: 4.0000 mg | ORAL_TABLET | Freq: Four times a day (QID) | ORAL | Status: DC | PRN

## 2020-02-12 MED ORDER — ISOSORBIDE MONONITRATE ER 60 MG PO TB24
30.0000 mg | ORAL_TABLET | Freq: Every day | ORAL | Status: DC
  Administered 2020-02-12: 30 mg via ORAL
  Filled 2020-02-12: qty 1

## 2020-02-12 MED ORDER — CLONIDINE 0.2 MG/24HR TD PTWK: 0.2000 mg | MEDICATED_PATCH | TRANSDERMAL | Status: AC

## 2020-02-12 MED ORDER — ASPIRIN EC 81 MG PO TBEC
81.0000 mg | DELAYED_RELEASE_TABLET | Freq: Every day | ORAL | Status: DC
  Administered 2020-02-12: 81 mg via ORAL
  Filled 2020-02-12: qty 1

## 2020-02-12 MED ORDER — DOXAZOSIN MESYLATE 2 MG PO TABS
2.0000 mg | ORAL_TABLET | Freq: Every day | ORAL | Status: DC
  Administered 2020-02-12: 2 mg via ORAL
  Filled 2020-02-12: qty 1

## 2020-02-12 MED ORDER — ACETAMINOPHEN 325 MG PO TABS: 650.0000 mg | ORAL_TABLET | Freq: Four times a day (QID) | ORAL | Status: DC | PRN

## 2020-02-12 MED ORDER — PSYLLIUM 95 % PO PACK
1.0000 | PACK | Freq: Two times a day (BID) | ORAL | Status: DC
  Administered 2020-02-12: 1 via ORAL
  Filled 2020-02-12 (×3): qty 1

## 2020-02-12 MED ORDER — ATORVASTATIN CALCIUM 20 MG PO TABS
80.0000 mg | ORAL_TABLET | Freq: Every day | ORAL | Status: DC
  Administered 2020-02-12: 80 mg via ORAL
  Filled 2020-02-12: qty 4

## 2020-02-12 MED ORDER — ALBUTEROL SULFATE (2.5 MG/3ML) 0.083% IN NEBU: 2.5000 mg | INHALATION_SOLUTION | RESPIRATORY_TRACT | Status: DC | PRN

## 2020-02-12 MED ORDER — GABAPENTIN 300 MG PO CAPS: 300.0000 mg | ORAL_CAPSULE | Freq: Every evening | ORAL | Status: DC

## 2020-02-12 MED ORDER — SERTRALINE HCL 50 MG PO TABS
50.0000 mg | ORAL_TABLET | Freq: Every day | ORAL | Status: DC
  Administered 2020-02-12: 50 mg via ORAL
  Filled 2020-02-12: qty 1

## 2020-02-12 MED ORDER — SODIUM CHLORIDE 0.9 % IV SOLN: INTRAVENOUS | Status: DC

## 2020-02-12 MED ORDER — INSULIN ASPART 100 UNIT/ML ~~LOC~~ SOLN
0.0000 [IU] | Freq: Three times a day (TID) | SUBCUTANEOUS | Status: DC
  Administered 2020-02-12: 11 [IU] via SUBCUTANEOUS
  Administered 2020-02-12: 5 [IU] via SUBCUTANEOUS
  Filled 2020-02-12 (×2): qty 1

## 2020-02-12 MED ORDER — ONDANSETRON HCL 4 MG/2ML IJ SOLN: 4.0000 mg | Freq: Four times a day (QID) | INTRAMUSCULAR | Status: DC | PRN

## 2020-02-12 MED ORDER — ACETAMINOPHEN 650 MG RE SUPP: 650.0000 mg | Freq: Four times a day (QID) | RECTAL | Status: DC | PRN

## 2020-02-12 MED ORDER — HEPARIN SODIUM (PORCINE) 5000 UNIT/ML IJ SOLN
5000.0000 [IU] | Freq: Three times a day (TID) | INTRAMUSCULAR | Status: DC
  Filled 2020-02-12: qty 1

## 2020-02-12 NOTE — ED Notes (Signed)
Lab notified of need for venipuncture assist for am labs. Spoke with [employer].

## 2020-02-12 NOTE — ED Notes (Signed)
Jackelyn Knife, RN with [facility] to inform her that patient is ready for discharge

## 2020-02-12 NOTE — Discharge Instructions (Signed)
He presented with low blood pressure and a event called near syncope where you almost passed out.  Your blood pressure was rather low on initial presentation to the emergency department.  I have made a few adjustments your home blood pressure regimen.  I recommend you only wear 1 clonidine patch for now and discontinue your amlodipine until you see a primary care doctor or an outside physician who can monitor and titrate your blood pressure medications appropriately.

## 2020-02-12 NOTE — Discharge Summary (Signed)
Physician Discharge Summary  Juan King WPY:[id number] DOB: 1951/04/17 DOA: 02/11/2020  PCP: Bobbye Morton, MD  Admit date: 02/11/2020 Discharge date: 02/12/2020  Admitted From: Home Disposition:  Home  Recommendations for Outpatient Follow-up:  1. Follow up with PCP in 1-2 weeks 2.   Home Health:No Equipment/Devices:None  Discharge Condition:Stable CODE STATUS:Full Diet recommendation: Heart Healthy Brief/Interim Summary: 69 y.o. 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 Patient defers history to caregiver. Caregiver reports that patient was sitting on commode when he got lightheaded and dizzy and fell over to the side but was caught by caregiver. Patient did not hit his head. Patient did not lose consciousness. Patient himself reports that he remembers the event, and he had associated tunnel vision. He agrees that he did not lose consciousness. He had been in his normal state of health for the day. He had no preceding factors including no chest pain, palpitations, shortness of breath. He reports that he just got weak and lightheaded. This is not happened to him before. Of note patient does have residual numbness of left leg. He has residual foot drop of left leg. Patient ambulates with a wheelchair. [facility]. Patient has no other complaints at this time.  6/7: Patient seen and examined the day of discharge.  Blood pressure recovered just by holding home blood pressure medications.  I lengthy conversation with the patient at bedside.  He is stable for discharge however I recommend that he make some adjustments in his home blood pressure medication.  I recommend that he hold 1 out of his 2 clonidine patches as well as hold his home amlodipine until seeing his  primary care physician.  These changes have been reflected in home discharge medication reconciliation.  Discharged home in stable condition.  Discharge Diagnoses:  Active Problems:   Syncope  Near syncope Likely secondary to overuse of medication Recommend patient hold home amlodipine Recommend decreasing clonidine to 1 patch 0.2 mg q. Weekly Coreg can continue at same dose Imdur to continue at same dose Outpatient follow-up for consideration for echocardiogram  Discharge Instructions  Discharge Instructions    Diet - low sodium heart healthy   Complete by: As directed    Increase activity slowly   Complete by: As directed      Allergies as of 02/12/2020   No Known Allergies     Medication List    STOP taking these medications   amLODipine 10 MG tablet Commonly known as: NORVASC     TAKE these medications   albuterol 108 (90 Base) MCG/ACT inhaler Commonly known as: VENTOLIN HFA Inhale 2 puffs into the lungs every 6 (six) hours as needed for wheezing or shortness of breath.   aspirin EC 81 MG tablet Take 81 mg by mouth daily. Reported on 03/06/2016   atorvastatin 80 MG tablet Commonly known as: LIPITOR Take 1 tablet (80 mg total) by mouth daily.   carvedilol 40 MG 24 hr capsule Commonly known as: COREG CR Take 40 mg by mouth daily.   carvedilol 6.25 MG tablet Commonly known as: COREG Take 1 tablet (6.25 mg total) by mouth 2 (two) times daily with a meal.   cholecalciferol 25 MCG (1000 UNIT) tablet Commonly known as: VITAMIN D3 Take 1,000 Units by mouth daily.   cloNIDine 0.2 mg/24hr  patch Commonly known as: CATAPRES - Dosed in mg/24 hr Place 1 patch (0.2 mg total) onto the skin once a week. Wear ONLY 1 PATCH at a time What changed: additional instructions   doxazosin 2 MG tablet Commonly known as: CARDURA Take 2 mg by mouth daily.   folic acid 1 MG tablet Commonly known as: FOLVITE Take 1 mg by mouth daily.   gabapentin 100 MG capsule Commonly  known as: NEURONTIN Take 1 capsule 3 times a day. What changed:   how much to take  how to take this  when to take this   hydrocortisone acetate 0.5 % cream Apply 1 application topically 2 (two) times daily.   isosorbide mononitrate 30 MG 24 hr tablet Commonly known as: IMDUR Take 1 tablet (30 mg total) by mouth daily.   latanoprost 0.005 % ophthalmic solution Commonly known as: XALATAN Place 1 drop into both eyes at bedtime.   methotrexate 2.5 MG tablet Commonly known as: RHEUMATREX Take 15 mg by mouth once a week. Caution:Chemotherapy. Protect from light.   psyllium 28 % packet Commonly known as: METAMUCIL SMOOTH TEXTURE Take 1 packet by mouth 2 (two) times daily.   sertraline 50 MG tablet Commonly known as: ZOLOFT Take 50 mg by mouth daily.   tiZANidine 2 MG tablet Commonly known as: ZANAFLEX Take 2 mg by mouth every 6 (six) hours as needed for muscle spasms.   triamcinolone cream 0.1 % Commonly known as: KENALOG Apply 1 application topically 2 (two) times daily. Apply twice a day until clear.      Follow-up Information    Bobbye Morton, MD. Schedule an appointment as soon as possible for a visit in 1 week(s).   Specialty: Family Medicine Contact information: [address] [place] [address] [phone]          No Known Allergies  Consultations:  None   Procedures/Studies: DG Chest Portable 1 View  Result Date: 02/11/2020 CLINICAL DATA:  Syncope. EXAM: PORTABLE CHEST 1 VIEW COMPARISON:  October 02, 2012 FINDINGS: There is no evidence of acute infiltrate, pleural effusion or pneumothorax. The heart size and mediastinal contours are within normal limits. The visualized skeletal structures are unremarkable. IMPRESSION: No active disease. Electronically Signed   By: Aram Candela M.D.   On: 02/11/2020 21:33    (Echo, Carotid, EGD, Colonoscopy, ERCP)    Subjective: Patient seen and examined on day of discharge.  No distress.   Stable for discharge home  Discharge Exam: Vitals:   02/12/20 1230 02/12/20 1417  BP: 140/77 137/71  Pulse:  71  Resp: 18 18  Temp:  98.5 F (36.9 C)  SpO2:  100%   Vitals:   02/12/20 1130 02/12/20 1200 02/12/20 1230 02/12/20 1417  BP: 140/74 124/83 140/77 137/71  Pulse: 62 68  71  Resp: 16 16 18 18   Temp:    98.5 F (36.9 C)  TempSrc:    Oral  SpO2: 99% 97%  100%  Weight:      Height:        General: Pt is alert, awake, not in acute distress Cardiovascular: RRR, S1/S2 +, no rubs, no gallops Respiratory: CTA bilaterally, no wheezing, no rhonchi Abdominal: Soft, NT, ND, bowel sounds + Extremities: no edema, no cyanosis    The results of significant diagnostics from this hospitalization (including imaging, microbiology, ancillary and laboratory) are listed below for reference.     Microbiology: No results found for this or any previous visit (from the past  240 hour(s)).   Labs: BNP (last 3 results) Recent Labs    02/11/20 2054  BNP 16.1   Basic Metabolic Panel: Recent Labs  Lab 02/11/20 2054 02/12/20 0612  NA 134* 139  K 4.9 4.0  CL 105 107  CO2 21* 24  GLUCOSE 286* 237*  BUN 14 17  CREATININE 1.30* 0.87  CALCIUM 9.0 8.9  MG  --  1.8   Liver Function Tests: Recent Labs  Lab 02/11/20 2054 02/12/20 0612  AST 28 16  ALT 20 21  ALKPHOS 76 75  BILITOT 1.5* 0.9  PROT 6.7 6.4*  ALBUMIN 3.3* 3.2*   No results for input(s): LIPASE, AMYLASE in the last 168 hours. No results for input(s): AMMONIA in the last 168 hours. CBC: Recent Labs  Lab 02/11/20 2054 02/12/20 0612  WBC 7.1  7.1 7.2  NEUTROABS 4.5 4.8  HGB 13.5  13.5 13.0  HCT 40.2  40.0 37.9*  MCV 93.9  93.7 91.5  PLT 307  299 295   Cardiac Enzymes: No results for input(s): CKTOTAL, CKMB, CKMBINDEX, TROPONINI in the last 168 hours. BNP: Invalid input(s): POCBNP CBG: Recent Labs  Lab 02/12/20 0759 02/12/20 1228  GLUCAP 203* 317*   D-Dimer No results for input(s): DDIMER in  the last 72 hours. Hgb A1c Recent Labs    02/12/20 0612  HGBA1C 11.4*   Lipid Profile No results for input(s): CHOL, HDL, LDLCALC, TRIG, CHOLHDL, LDLDIRECT in the last 72 hours. Thyroid function studies Recent Labs    02/12/20 0612  TSH 0.488   Anemia work up No results for input(s): VITAMINB12, FOLATE, FERRITIN, TIBC, IRON, RETICCTPCT in the last 72 hours. Urinalysis    Component Value Date/Time   COLORURINE YELLOW (A) 07/19/2018 0018   APPEARANCEUR HAZY (A) 07/19/2018 0018   APPEARANCEUR Hazy 07/10/2013 1824   LABSPEC 1.020 07/19/2018 0018   LABSPEC 1.011 07/10/2013 1824   PHURINE 6.0 07/19/2018 0018   GLUCOSEU 150 (A) 07/19/2018 0018   GLUCOSEU 150 mg/dL 07/10/2013 1824   HGBUR NEGATIVE 07/19/2018 0018   BILIRUBINUR NEGATIVE 07/19/2018 0018   BILIRUBINUR Negative 07/10/2013 1824   KETONESUR NEGATIVE 07/19/2018 0018   PROTEINUR NEGATIVE 07/19/2018 0018   NITRITE NEGATIVE 07/19/2018 0018   LEUKOCYTESUR SMALL (A) 07/19/2018 0018   LEUKOCYTESUR Negative 07/10/2013 1824   Sepsis Labs Invalid input(s): PROCALCITONIN,  WBC,  LACTICIDVEN Microbiology No results found for this or any previous visit (from the past 240 hour(s)).   Time coordinating discharge: Over 30 minutes  SIGNED:   Sidney Ace, MD  [facility] 02/12/2020, 3:58 PM Pager   If 7PM-7AM, please contact night-coverage

## 2020-02-12 NOTE — ED Notes (Signed)
Provided pt with water per request.

## 2020-02-12 NOTE — ED Notes (Signed)
Pt cleansed of incontinent urine. Condom cath applied by tom, rn.

## 2020-02-12 NOTE — ED Notes (Signed)
Admitting provider at bedside.

## 2020-02-12 NOTE — ED Notes (Addendum)
Unable to complete orthostatic vital signs. Pt is not able to stand and cannot sit up without assist. Report to [place], rn.

## 2020-02-12 NOTE — ED Notes (Signed)
Explanation of admission process provided to pt who verbalizes understanding. toileting offered and declined. Pt states "I'm fine". resps unlabored. Call bell at right side.

## 2020-02-12 NOTE — ED Notes (Signed)
Pink MOST form given to this RN by EMS driver that brought pt to the hospital

## 2020-02-12 NOTE — ED Notes (Signed)
Lab here for venipuncture.  

## 2020-02-12 NOTE — TOC Progression Note (Signed)
Transition of Care [facility]) - Progression Note    Patient Details  Name: Juan King MRN: [id number] Date of Birth: 01-05-1951  Transition of Care [facility]) CM/SW Contact  Marina Goodell Phone Number: [phone] 02/12/2020, 9:14 AM  Clinical Narrative:     [facility] is a participant in [facility].  They will provide transport when the patient is ready to d/c.  Please contact Shelia RN [phone], or the main office [phone] when patient is read for d/c, they will not be able to pick up the patient after 4:00.  Attending and RN staff notified.      Expected Discharge Plan and Services                                                 Social Determinants of Health (SDOH) Interventions    Readmission Risk Interventions No flowsheet data found.

## 2020-02-12 NOTE — ED Notes (Signed)
Pt sleeping. resps unlabored.  

## 2020-02-12 NOTE — Progress Notes (Signed)
OT Cancellation Note  Patient Details Name: Juan King MRN: [id number] DOB: 06/17/1951   Cancelled Treatment:    Reason Eval/Treat Not Completed: OT screened, no needs identified, will sign off. OT order received and chart reveiwed. Pt is dependent for ADLs and w/c transfers at baseline - pt reporting at baseline this date. Pt has 24/7 caregivers and has no further skilled OT needs identified at this time. Please re-consult if new needs arise.   Kathie Dike, M.S. OTR/L  02/12/20, 1:29 PM

## 2020-02-12 NOTE — ED Notes (Signed)
Pt given breakfast tray. PT's sausage and pancake cut up by this RN to aid in eating.

## 2021-01-05 ENCOUNTER — Emergency Department
Admission: EM | Admit: 2021-01-05 | Discharge: 2021-01-07 | Disposition: A | Discharge status: Home / Self Care | Payer: Medicare (Managed Care) | Attending: Emergency Medicine | Admitting: Emergency Medicine

## 2021-01-05 ENCOUNTER — Other Ambulatory Visit: Payer: Self-pay

## 2021-01-05 ENCOUNTER — Emergency Department: Payer: Medicare (Managed Care)

## 2021-01-05 DIAGNOSIS — M6281 Muscle weakness (generalized): Secondary | ICD-10-CM | POA: Insufficient documentation

## 2021-01-05 DIAGNOSIS — I10 Essential (primary) hypertension: Secondary | ICD-10-CM | POA: Diagnosis not present

## 2021-01-05 DIAGNOSIS — R531 Weakness: Secondary | ICD-10-CM | POA: Diagnosis present

## 2021-01-05 DIAGNOSIS — U071 COVID-19: Secondary | ICD-10-CM | POA: Diagnosis not present

## 2021-01-05 DIAGNOSIS — E119 Type 2 diabetes mellitus without complications: Secondary | ICD-10-CM | POA: Diagnosis not present

## 2021-01-05 HISTORY — DX: Type 2 diabetes mellitus without complications: E11.9

## 2021-01-05 HISTORY — DX: Cerebral infarction, unspecified: I63.9

## 2021-01-05 HISTORY — DX: Pure hypercholesterolemia, unspecified: E78.00

## 2021-01-05 HISTORY — DX: Essential (primary) hypertension: I10

## 2021-01-05 LAB — COMPREHENSIVE METABOLIC PANEL
ALT: 23 U/L (ref 0–44)
AST: 26 U/L (ref 15–41)
Albumin: 3.9 g/dL (ref 3.5–5.0)
Alkaline Phosphatase: 60 U/L (ref 38–126)
Anion gap: 12 (ref 5–15)
BUN: 22 mg/dL (ref 8–23)
CO2: 22 mmol/L (ref 22–32)
Calcium: 9.3 mg/dL (ref 8.9–10.3)
Chloride: 106 mmol/L (ref 98–111)
Creatinine, Ser: 0.99 mg/dL (ref 0.61–1.24)
GFR, Estimated: >60 mL/min (ref >60)
Glucose, Bld: 98 mg/dL (ref 70–99)
Potassium: 4.5 mmol/L (ref 3.5–5.1)
Sodium: 140 mmol/L (ref 135–145)
Total Bilirubin: 0.9 mg/dL (ref 0.3–1.2)
Total Protein: 7.8 g/dL (ref 6.5–8.1)

## 2021-01-05 LAB — CBC WITH DIFFERENTIAL/PLATELET
Abs Immature Granulocytes: 0.02 10*3/uL (ref 0.00–0.07)
Basophils Absolute: 0 10*3/uL (ref 0.0–0.1)
Basophils Relative: 0 %
Eosinophils Absolute: 0.1 10*3/uL (ref 0.0–0.5)
Eosinophils Relative: 2 %
HCT: 45.3 % (ref 39.0–52.0)
Hemoglobin: 15.1 g/dL (ref 13.0–17.0)
Immature Granulocytes: 0 %
Lymphocytes Relative: 25 %
Lymphs Abs: 1.2 10*3/uL (ref 0.7–4.0)
MCH: 32.2 pg (ref 26.0–34.0)
MCHC: 33.3 g/dL (ref 30.0–36.0)
MCV: 96.6 fL (ref 80.0–100.0)
Monocytes Absolute: 0.6 10*3/uL (ref 0.1–1.0)
Monocytes Relative: 12 %
Neutro Abs: 2.9 10*3/uL (ref 1.7–7.7)
Neutrophils Relative %: 61 %
Platelets: 205 10*3/uL (ref 150–400)
RBC: 4.69 MIL/uL (ref 4.22–5.81)
RDW: 15 % (ref 11.5–15.5)
WBC: 4.8 10*3/uL (ref 4.0–10.5)
nRBC: 0 % (ref 0.0–0.2)

## 2021-01-05 LAB — RESP PANEL BY RT-PCR (FLU A&B, COVID) ARPGX2
Influenza A by PCR: NEGATIVE
Influenza B by PCR: NEGATIVE
SARS Coronavirus 2 by RT PCR: POSITIVE — AB

## 2021-01-05 NOTE — ED Triage Notes (Signed)
Pt arrives via EMS from home for complaints of weakness. Per medics pt lives at home alone and has home health that comes once a day in the mornings but he was recently diagnosed with COVID and states noone is coming anymore. EMS found patient lying in bed which was full of urine and feces. Pt states he just wasn't bale to get out of bed. Denies any pain, shortness of breath, vomiting or diarrhea.

## 2021-01-05 NOTE — ED Provider Notes (Signed)
[facility] Emergency Department Provider Note  ____________________________________________   Event Date/Time   First MD Initiated Contact with Patient 01/05/21 1429     (approximate)  I have reviewed the triage vital signs and the nursing notes.   HISTORY  Chief Complaint Weakness  Level 5 caveat: Patient's history significantly limited by his history of stroke and dementia  HPI Juan King is a 70 y.o. male who presents to the emergency department from home via EMS for evaluation.  The charge nurse received a call prior to his arrival from the nurse practitioner of [facility] noting that typically the patient has care at [facility] 3 times per week with home health on the in between days, however he recently tested positive for COVID and thus no one has been going to the house since Friday evening secondary to his diagnosis.  He is unable to get up and move on his own, and thus when EMS found him he was covered in his own urine and feces.  The patient denies any new weakness, cough, fever, shortness of breath or other complaints.       Past Medical History:  Diagnosis Date  . Diabetes mellitus without complication (HCC)   . Hypercholesteremia   . Hypertension   . Stroke [facility])     There are no problems to display for this patient.    Prior to Admission medications   Not on File    Allergies Patient has no allergy information on record.  No family history on file.  Social History    Review of Systems Constitutional: No fever/chills Eyes: No visual changes. ENT: No sore throat. Cardiovascular: Denies chest pain. Respiratory: Denies shortness of breath. Gastrointestinal: No abdominal pain.  No nausea, no vomiting.  No diarrhea.  No constipation. Genitourinary: Negative for dysuria. Musculoskeletal: Negative for back pain. Skin: Negative for rash. Neurological: Negative for headaches, focal weakness or  numbness.  ____________________________________________   PHYSICAL EXAM:  VITAL SIGNS: ED Triage Vitals  Enc Vitals Group     BP      Pulse      Resp      Temp      Temp src      SpO2      Weight      Height      Head Circumference      Peak Flow      Pain Score      Pain Loc      Pain Edu?      Excl. in GC?    Constitutional: Alert and oriented to self and place only. Well appearing and in no acute distress. Eyes: Conjunctivae are normal. PERRL. EOMI. Head: Atraumatic. Nose: No congestion/rhinnorhea. Mouth/Throat: Mucous membranes are moist.  Oropharynx non-erythematous. Neck: No stridor.   Cardiovascular: Normal rate, regular rhythm. Grossly normal heart sounds.  Good peripheral circulation. Respiratory: Normal respiratory effort.  No retractions. Lungs CTAB. Gastrointestinal: Soft and nontender. No distention. No abdominal bruits. No CVA tenderness. Musculoskeletal: No lower extremity tenderness nor edema.  No joint effusions. Neurologic:  Normal speech and language.  Left-sided deficits of the upper and lower extremities consistent with patient's history of stroke Skin:  Skin is warm, dry and intact. No rash noted. Psychiatric: Mood and affect are normal. Speech and behavior are normal.  ____________________________________________   LABS (all labs ordered are listed, but only abnormal results are displayed)  Labs Reviewed  RESP PANEL BY RT-PCR (FLU A&B, COVID) ARPGX2 - Abnormal; Notable for  the following components:      Result Value   SARS Coronavirus 2 by RT PCR POSITIVE (*)    All other components within normal limits  COMPREHENSIVE METABOLIC PANEL  CBC WITH DIFFERENTIAL/PLATELET    ____________________________________________  RADIOLOGY Susa Raring, personally viewed and evaluated these images (plain radiographs) as part of my medical decision making, as well as reviewing the written report by the radiologist.  ED provider interpretation: No  acute focal pneumonia or other acute disease  Official radiology report(s): DG Chest Portable 1 View  Result Date: 01/05/2021 CLINICAL DATA:  Cough.  COVID positive. EXAM: PORTABLE CHEST 1 VIEW COMPARISON:  None. FINDINGS: 1542 hours. The lungs are clear without focal pneumonia, edema, pneumothorax or pleural effusion. The cardiopericardial silhouette is within normal limits for size. The visualized bony structures of the thorax show no acute abnormality. Telemetry leads overlie the chest. IMPRESSION: No active disease. Electronically Signed   By: Kennith Center M.D.   On: 01/05/2021 15:52    ____________________________________________   INITIAL IMPRESSION / ASSESSMENT AND PLAN / ED COURSE  As part of my medical decision making, I reviewed the following data within the electronic MEDICAL RECORD NUMBER Nursing notes reviewed and incorporated, Labs reviewed, Radiograph reviewed and Notes from prior ED visits        Patient is a 70 year old male who presents to the emergency department via EMS for evaluation and concern of being unable to perform ADLs.  Patient has history of stroke with left-sided deficits and he receives home health nursing and assistance typically.  They have been unable to assist him secondary to his recent COVID diagnosis.  He denies any new weakness, fever, chest pain or shortness of breath.  He states he feels at his baseline.  The case was discussed with his emergency contact who reports that she is unable to assist him due to caring for her 52 year old mother and she is afraid of cross transmitting COVID.  At this time, will hold the patient here for boarding until social work, PT and OT can evaluate the patient and the situation and find appropriate disposition for him.  The patient is being signed out to oncoming attending Dr. Vicente Males who will monitor the patient in the interim.      ____________________________________________   FINAL CLINICAL IMPRESSION(S) / ED  DIAGNOSES  Final diagnoses:  COVID     ED Discharge Orders    None      *Please note:  Juan King was evaluated in Emergency Department on 01/05/2021 for the symptoms described in the history of present illness. He was evaluated in the context of the global COVID-19 pandemic, which necessitated consideration that the patient might be at risk for infection with the SARS-CoV-2 virus that causes COVID-19. Institutional protocols and algorithms that pertain to the evaluation of patients at risk for COVID-19 are in a state of rapid change based on information released by regulatory bodies including the CDC and federal and state organizations. These policies and algorithms were followed during the patient's care in the ED.  Some ED evaluations and interventions may be delayed as a result of limited staffing during and the pandemic.*   Note:  This document was prepared using Dragon voice recognition software and may include unintentional dictation errors.   Lucy Chris, PA 01/05/21 2132    Merwyn Katos, MD 01/05/21 2322

## 2021-01-06 NOTE — ED Notes (Signed)
Pt has been given a lunch tray and drink

## 2021-01-06 NOTE — TOC Progression Note (Signed)
Transition of Care [facility]) - Progression Note    Patient Details  Name: Juan King MRN: [id number] Date of Birth: 1951-01-29  Transition [facility]) CM/SW Contact  Marina Goodell Phone Number: [phone] 01/06/2021, 5:13 PM  Clinical Narrative:     Patient d/c cancelled dur to concern the patient will not make it home on time to meet with [facility] care assistants. Patient was originally scheduled to leave with [facility] but when they arrived they refused to take patient due to COVID positive COVID status.  Unfortunately, EMS cannot guarantee ETA, therefore after consultation with Dr. Vicente Males the decision was made due concern for patient's safety he will remain in the ED until tomorrow 01/07/2021.    Barriers to Discharge: No Barriers Identified  Expected Discharge Plan and Services                                                 Social Determinants of Health (SDOH) Interventions    Readmission Risk Interventions No flowsheet data found.

## 2021-01-06 NOTE — TOC Initial Note (Signed)
Transition of Care [facility]) - Initial/Assessment Note    Patient Details  Name: Ronson Hagins MRN: [id number] Date of Birth: [birth year]  Transition of Care [facility]) CM/SW Contact:    Marina Goodell Phone Number:  [phone] 01/06/2021, 9:16 AM  Clinical Narrative:                  Patient presents to [facility] due to weakness.  Patient lives at home alone and was receiving home health services but the [facility] providers stopped coming once he was COVID +. Patient is currently active with [facility] [phone]. CSW contacted [facility] and left voicemail for her case worker Acupuncturist.          Patient Goals and CMS Choice        Expected Discharge Plan and Services                                                Prior Living Arrangements/Services                       Activities of Daily Living      Permission Sought/Granted                  Emotional Assessment              Admission diagnosis:  weakness There are no problems to display for this patient.  PCP:  Delton Prairie, FNP Pharmacy:   [facility], [place] - [address] [place] [address] Phone: [phone] Fax: [phone]     Social Determinants of Health (SDOH) Interventions    Readmission Risk Interventions No flowsheet data found.

## 2021-01-06 NOTE — ED Notes (Signed)
Pt cleaned of small amount of stool.  

## 2021-01-06 NOTE — ED Notes (Signed)
Called and cancelled [facility] transport as Teresita and Dr. Vicente Males concerned patient would not be there in time and cannot be left alone  1712

## 2021-01-06 NOTE — ED Notes (Signed)
Called [facility] for transport to [address] Azucena Freed [place], [place] [address]

## 2021-01-06 NOTE — ED Notes (Signed)
[facility] came to pick up patient to go home at 1635 and called dispatcher and found out they couldn't take patient due to patient being covid +  They left approximately at 1646 and I called [facility] for transport 1654  As I was in process doing a [facility] transport.

## 2021-01-06 NOTE — Evaluation (Signed)
Physical Therapy Evaluation Patient Details Name: Juan King MRN: [id number] DOB: Feb 18, 1951 Today's Date: 01/06/2021   History of Present Illness  70 y.o. male who presents to the emergency department from home on 01/05/21 via EMS for evaluation.  The charge nurse received a call prior to his arrival from the nurse practitioner of [facility] noting that typically the patient has care at [facility] 3 times per week with home health on the in between days. 2x/day, however he recently tested positive for COVID and thus no one has been going to the house since Friday evening secondary to his diagnosis. The patient denies any new weakness, cough, fever, shortness of breath or other complaints. PMHX includes DMII, HTN, CVA (residual L side deficits).  Clinical Impression  Patient resting in bed upon arrival to room; alert and oriented, follows commands and agreeable to participation with session.  Denies pain, simply endorses generalized sense of weakness and fatigue.  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  Currently requiring min assist for bed mobility; min/mod assist for SPT between seating surfaces (min towards R, mod towards L).  Generally unsteady and unable to complete without physical assist from helper. Do anticipate improved performance with normal, home set up and environmental supports. Would benefit from skilled PT to address above deficits and promote optimal return to PLOF.; recommend transition to STR upon discharge from acute hospitalization (if returning home with continued support from [facility], recommend increased level of support/caregiver assist).     Follow Up Recommendations SNF    Equipment Recommendations       Recommendations for Other Services       Precautions / Restrictions Precautions Precautions: Fall Restrictions Weight Bearing Restrictions: No Other  Position/Activity Restrictions: L side hemiparesis from old CVA      Mobility  Bed Mobility Overal bed mobility: Needs Assistance Bed Mobility: Supine to Sit;Sit to Supine Rolling: Min assist   Supine to sit: Min assist Sit to supine: Min assist   General bed mobility comments: very reliant on bedrails, R UE to complete movement transiton.  Rolls towards R side for scooting up in bed, but able to complete without additional physical assist    Transfers Overall transfer level: Needs assistance   Transfers: Stand Pivot Transfers   Stand pivot transfers: Min assist;Mod assist       General transfer comment: min assist towards R, mod assist towards L; very dependant on R UE for external stabilization; minimal/no WBing on L LE with transfer, simply pivots on R LE once UE is in contact with seating surface.  Poor balance/stability with transfer towards L  Ambulation/Gait             General Gait Details: non-ambulatory at baseline  Stairs            Wheelchair Mobility    Modified Rankin (Stroke Patients Only)       Balance Overall balance assessment: Needs assistance Sitting-balance support: No upper extremity supported;Feet supported Sitting balance-Leahy Scale: Good     Standing balance support: Single extremity supported Standing balance-Leahy Scale: Poor                               Pertinent Vitals/Pain Pain Assessment: No/denies pain    Home Living Family/patient expects to be discharged to:: Private residence Living Arrangements: Alone Available Help at Discharge: Friend(s);Personal care attendant;Available  PRN/intermittently Type of Home: Apartment (handicap accessible)         Home Equipment: Bedside commode;Tub bench;Wheelchair - manual;Hospital bed      Prior Function Level of Independence: Needs assistance   Gait / Transfers Assistance Needed: Per pt and [facility] OT, pt was able to perform transfers bed/wheelchair/BSC  modified independently, able to self propel in wheelchair  ADL's / Homemaking Assistance Needed: Per pt and [facility] OT, pt was able to get to/from [facility], HH aides assisted with TTB baths, dressing, meals, and medications, and transportation to/from [facility]        Hand Dominance   Dominant Hand: Right    Extremity/Trunk Assessment   Upper Extremity Assessment Upper Extremity Assessment:  (baseline L hemiparesis, very limited ROM L shoulder, L elbow contracted in flexed position, strength grossly 2-/5 throughout, generalized paresthesia reported.  R UE grossly WFL)    Lower Extremity Assessment Lower Extremity Assessment: Generalized weakness (L LE grossly 2+/5 throughout, L ankle to neutral passively.  R LE grossly 4-/5)       Communication   Communication: No difficulties  Cognition Arousal/Alertness: Awake/alert Behavior During Therapy: WFL for tasks assessed/performed Overall Cognitive Status: Within Functional Limits for tasks assessed                                 General Comments: Pt alert and oriented to place, generally to situation, follows commands well, per [facility] OT low literacy/health literacy, unable to read/write      General Comments      Exercises Other Exercises Other Exercises: bed level toileting with RN to assist, Min A rolling, Max A pericare   Assessment/Plan    PT Assessment Patient needs continued PT services  PT Problem List Decreased strength;Decreased range of motion;Decreased activity tolerance;Decreased balance;Decreased mobility;Decreased coordination;Decreased cognition;Decreased knowledge of use of DME;Decreased safety awareness       PT Treatment Interventions DME instruction;Functional mobility training;Therapeutic activities;Therapeutic exercise;Balance training;Patient/family education    PT Goals (Current goals can be found in the Care Plan section)  Acute Rehab PT Goals Patient Stated Goal: go back home PT Goal  Formulation: With patient Time For Goal Achievement: 01/20/21 Potential to Achieve Goals: Good    Frequency Min 2X/week   Barriers to discharge        Co-evaluation               AM-[facility] PT "6 Clicks" Mobility  Outcome Measure Help needed turning from your back to your side while in a flat bed without using bedrails?: None Help needed moving from lying on your back to sitting on the side of a flat bed without using bedrails?: A Little Help needed moving to and from a bed to a chair (including a wheelchair)?: A Lot Help needed standing up from a chair using your arms (e.g., wheelchair or bedside chair)?: A Little Help needed to walk in hospital room?: Total Help needed climbing 3-5 steps with a railing? : Total 6 Click Score: 14    End of Session   Activity Tolerance: Patient tolerated treatment well Patient left: in bed;with call bell/phone within reach   PT Visit Diagnosis: Muscle weakness (generalized) (M62.81)    Time: 1113-1130 PT Time Calculation (min) (ACUTE ONLY): 17 min   Charges:   PT Evaluation $PT Eval Moderate Complexity: 1 Mod         Juan King H. Manson Passey, PT, DPT, NCS 01/06/21, 2:56 PM [phone]

## 2021-01-06 NOTE — ED Provider Notes (Signed)
  Our social work colleagues has been able to arrange for increased home care for the patient through [facility] where he will double coverage at home 7 days a week until his quarantine period is over for COVID-19.  Right has been arranged for 3:30 PM to take the patient home today.  Patient has remained stable throughout his stay in the ED today, and has required some nursing care.  Considering this increased assistance at home, I see no barriers to outpatient management.  We will discharge with [facility] assistance.    Delton Prairie, MD 01/06/21 [phone]

## 2021-01-06 NOTE — ED Notes (Signed)
Pt has been cleaned, turned, and provided with a new diaper

## 2021-01-06 NOTE — TOC Transition Note (Signed)
Transition of Care [facility]) - CM/SW Discharge Note   Patient Details  Name: Juan King MRN: [id number] Date of Birth: May 04, 1951  Transition of Care [facility]) CM/SW Contact:  Marina Goodell Phone Number:  [phone] 01/06/2021, 1:57 PM   Clinical Narrative:     Patient will d/c home with [facility], 2 X per day 7 days per week.  CSW confirmed with Kayleen Memos, at [facility].  Patient will transport home w/ [facility] @ 3:30PM.  [facility] will arrive to the patient's home before 6:00PM today 01/06/2021. EDP/ED Staff updated.    Barriers to Discharge: No Barriers Identified   Patient Goals and CMS Choice Patient states their goals for this hospitalization and ongoing recovery are:: To get stronger      Discharge Placement                       Discharge Plan and Services                                     Social Determinants of Health (SDOH) Interventions     Readmission Risk Interventions No flowsheet data found.

## 2021-01-06 NOTE — Evaluation (Signed)
Occupational Therapy Evaluation Patient Details Name: Juan King MRN: [id number] DOB: 1950-09-25 Today's Date: 01/06/2021    History of Present Illness 70 y.o. male who presents to the emergency department from home on 01/05/21 via EMS for evaluation.  The charge nurse received a call prior to his arrival from the nurse practitioner of [facility] noting that typically the patient has care at [facility] 3 times per week with home health on the in between days. 2x/day, however he recently tested positive for COVID and thus no one has been going to the house since Friday evening secondary to his diagnosis. The patient denies any new weakness, cough, fever, shortness of breath or other complaints. PMHX includes DMII, HTN, CVA (residual L side deficits).   Clinical Impression   Pt was seen for OT evaluation this date. Pt alert and oriented to self, place, and generally to situation, follows commands, and eager to return home. OT spoke with [facility] OT who provided majority of background and PLOF information. Prior to hospital admission, pt was living by himself in a handicap accessible apartment with [facility] aides providing assist for meds, meals, transportation, shower transfers and bathing, and dressing 2x/day (morning and early evenings). Pt has low health literacy, unable to read and write, per [facility] OT's report. At baseline, pt was able to perform stand pivot transfers with use of pole/grab bar with modified independence from bed/BSC/w/c and able to self propel w/c throughout his home. At time of evaluation, pt required MIN A for bed mobility. Pt noted to have bowel incontinence. OOB deferred. Bed level toileting involving rolling requiring MAX A for pericare and MIN A for rolling. Currently pt demonstrates impairments as described below (See OT problem list) which functionally limit his ability to perform ADL/self-care tasks. Pt currently requires increased assist for ADL mobility and ADL tasks. Pt would benefit  from skilled OT services to address noted impairments and functional limitations (see below for any additional details) in order to maximize safety and independence while minimizing falls risk and caregiver burden. Upon hospital discharge, recommend short term rehab at SNF to facilitate safe eventual return home with assist.     Follow Up Recommendations  SNF    Equipment Recommendations  None recommended by OT (has all necessary equipment)    Recommendations for Other Services       Precautions / Restrictions Precautions Precautions: Fall Restrictions Other Position/Activity Restrictions: L side hemiparesis from old CVA      Mobility Bed Mobility Overal bed mobility: Needs Assistance Bed Mobility: Rolling;Supine to Sit;Sit to Supine Rolling: Min assist   Supine to sit: Min assist Sit to supine: Min assist   General bed mobility comments: cues for hand placement    Transfers                 General transfer comment: deferred, pt noted with bowel incontinence and BP 161/99    Balance Overall balance assessment: Needs assistance Sitting-balance support: Feet supported;Single extremity supported Sitting balance-Leahy Scale: Fair                                     ADL either performed or assessed with clinical judgement   ADL Overall ADL's : Needs assistance/impaired  General ADL Comments: Pt required MAX A for pericare after toileting at bed level, MIN-MOD A for LB ADL (able to initiate donning over feet using RUE) and using figure 4 method     Vision Baseline Vision/History: Wears glasses Wears Glasses: At all times Patient Visual Report: No change from baseline       Perception     Praxis      Pertinent Vitals/Pain Pain Assessment: No/denies pain     Hand Dominance Right   Extremity/Trunk Assessment Upper Extremity Assessment Upper Extremity Assessment: Generalized weakness  (hx LUE hemiparesis, fingers in extension but able to flex somewhat, <50% elbow ROM, severely limited shoulder ROM, per [facility] OT has frozen shoulder, decreased strength, coordination, sensation)   Lower Extremity Assessment Lower Extremity Assessment: Generalized weakness (hx LLE hemiparesis, decreased strength, coordination, sensation)       Communication Communication Communication: No difficulties   Cognition Arousal/Alertness: Awake/alert Behavior During Therapy: WFL for tasks assessed/performed Overall Cognitive Status: No family/caregiver present to determine baseline cognitive functioning                                 General Comments: Pt alert and oriented to place, generally to situation, follows commands well, per [facility] OT low literacy/health literacy, unable to read/write   General Comments       Exercises Other Exercises Other Exercises: bed level toileting with RN to assist, Min A rolling, Max A pericare   Shoulder Instructions      Home Living Family/patient expects to be discharged to:: Private residence Living Arrangements: Alone Available Help at Discharge: Friend(s);Personal care attendant;Available PRN/intermittently (has HH aides 2x/day, daily, goes to [facility] 3x/wk M/W/F) Type of Home: Apartment (handicap accessible apartment)             Bathroom Shower/Tub: Tub/shower unit         Home Equipment: Bedside commode;Tub bench;Wheelchair - manual;Hospital bed          Prior Functioning/Environment Level of Independence: Needs assistance  Gait / Transfers Assistance Needed: Per pt and [facility] OT, pt was able to perform transfers bed/wheelchair/BSC modified independently, able to self propel in wheelchair ADL's / Homemaking Assistance Needed: Per pt and [facility] OT, pt was able to get to/from [facility], HH aides assisted with TTB baths, dressing, meals, and medications, and transportation to/from [facility]            OT Problem List: Decreased  strength;Decreased coordination;Cardiopulmonary status limiting activity;Decreased range of motion;Decreased cognition;Impaired sensation;Decreased safety awareness;Impaired balance (sitting and/or standing);Decreased knowledge of use of DME or AE;Impaired UE functional use      OT Treatment/Interventions: Self-care/ADL training;Therapeutic exercise;Therapeutic activities;DME and/or AE instruction;Neuromuscular education;Patient/family education;Balance training;Cognitive remediation/compensation;Energy conservation    OT Goals(Current goals can be found in the care plan section) Acute Rehab OT Goals Patient Stated Goal: go back home OT Goal Formulation: With patient Time For Goal Achievement: 01/20/21 Potential to Achieve Goals: Good ADL Goals Pt Will Transfer to Toilet: squat pivot transfer;bedside commode;with supervision Additional ADL Goal #1: Pt will perform bed mobility with modified independence Additional ADL Goal #2: Pt will utilize use of visual aides/reminders to support bowel/bladder mgt and need for assist 3/3 opportunities.  OT Frequency: Min 1X/week   Barriers to D/C:            Co-evaluation              AM-PAC OT "6 Clicks" Daily Activity  Outcome Measure Help from another person eating meals?: A Little Help from another person taking care of personal grooming?: A Lot Help from another person toileting, which includes using toliet, bedpan, or urinal?: A Lot Help from another person bathing (including washing, rinsing, drying)?: A Lot Help from another person to put on and taking off regular upper body clothing?: A Little Help from another person to put on and taking off regular lower body clothing?: A Lot 6 Click Score: 14   End of Session    Activity Tolerance: Patient tolerated treatment well Patient left: in bed;with call bell/phone within reach  OT Visit Diagnosis: Other abnormalities of gait and mobility (R26.89);Muscle weakness (generalized)  (M62.81)                Time: 6160-7371 OT Time Calculation (min): 31 min Charges:  OT General Charges $OT Visit: 1 Visit OT Evaluation $OT Eval Moderate Complexity: 1 Mod OT Treatments $Self Care/Home Management : 8-22 mins  Wynona Canes, MPH, MS, OTR/L ascom [phone] 01/06/21, 12:58 PM

## 2021-01-06 NOTE — ED Notes (Signed)
Pt provided with orange juice, diet ordered.  Awaiting POC from [facility] at this time.

## 2021-01-07 ENCOUNTER — Encounter: Payer: Self-pay | Admitting: *Deleted

## 2021-01-07 NOTE — ED Notes (Signed)
No IV access noted by this RN.  Pt cleansed, new brief placed.  D/C instructions given.  Advised of covid protocol.  Advised to return to ER for worsening symptoms.  All questions addressed.  Understanding verbalized by pt.  Pt transported home via w/c by [facility].

## 2021-01-07 NOTE — TOC Transition Note (Signed)
Transition of Care [facility]) - CM/SW Discharge Note   Patient Details  Name: Juan King MRN: [id number] Date of Birth: 12-28-1950  Transition of Care [facility]) CM/SW Contact:  Marina Goodell Phone Number: [phone] 01/07/2021, 10:54 AM   Clinical Narrative:    Patient will d/c home with [facility], 2 X per day 7 days per week.  CSW confirmed with Kayleen Memos, at [facility].  Patient will transport home w/ [facility] transportation.  EDP/ED Staff updated.     Barriers to Discharge: No Barriers Identified   Patient Goals and CMS Choice Patient states their goals for this hospitalization and ongoing recovery are:: To get stronger      Discharge Placement                       Discharge Plan and Services                                     Social Determinants of Health (SDOH) Interventions     Readmission Risk Interventions No flowsheet data found.

## 2021-01-07 NOTE — TOC Progression Note (Signed)
Transition of Care [facility]) - Progression Note    Patient Details  Name: Juan King MRN: [id number] Date of Birth: 1951/05/10  Transition [facility]) CM/SW Contact  Marina Goodell Phone Number: [phone] 01/07/2021, 8:59 AM  Clinical Narrative:     Patient will d/c home pending transportation.  CSW is waiting for confirmation from [facility] to determine if they will provide transportation.     Barriers to Discharge: No Barriers Identified  Expected Discharge Plan and Services                                                 Social Determinants of Health (SDOH) Interventions    Readmission Risk Interventions No flowsheet data found.

## 2021-01-08 ENCOUNTER — Telehealth: Payer: Self-pay

## 2021-01-08 NOTE — Telephone Encounter (Signed)
Called to discuss with patient about COVID-19 symptoms and the use of one of the available treatments for those with mild to moderate Covid symptoms and at a high risk of hospitalization.  Pt appears to qualify for outpatient treatment due to co-morbid conditions and/or a member of an at-risk group in accordance with the FDA Emergency Use Authorization.    Symptom onset: 01/05/21 Vaccinated: Unknown Booster? Unknown Immunocompromised? Yes Qualifiers: Asthma,HTN NIH Criteria:  Spoke with Yolanda at [facility].States they are providing care and his PCP will be seeing him today.  Esther Hardy

## 2021-02-19 ENCOUNTER — Encounter: Payer: Self-pay | Admitting: Ophthalmology

## 2021-02-28 NOTE — Pre-Procedure Instructions (Signed)
    Juan King  02/28/2021      [id number] - [address]), [place] - [address] [place]) [place] [address] Phone: [phone] Fax: [phone]  [facility] [place], [place] - [address] [address] Phone: [phone] Fax: [phone]     Your procedure is scheduled on [facility] 03/03/21.  Report to Admitting  AT [facility] at 0700 A.M.  Call this number if you have problems the morning of surgery:  [phone]   Remember:  Do not eat or drink after midnight.  You may drink clear liquids until 10pm .  Clear liquids allowed are:                    Water    STOP TAKING JARDIANCE ON Friday   Take these medicines the morning of surgery with A SIP OF WATER -all AM meds- DO NOT TAKE MEDS WITH FOOD.     Do not wear jewelry, make-up or nail polish.  Do not wear lotions, powders, or perfumes, or deodorant.  Do not shave 48 hours prior to surgery.  Men may shave face and neck.  Do not bring valuables to the hospital.  [facility] is not responsible for any belongings or valuables.  Contacts, dentures or bridgework may not be worn into surgery.  Leave your suitcase in the car.  After surgery it may be brought to your room.  For patients admitted to the hospital, discharge time will be determined by your treatment team.  Patients discharged the day of surgery will not be allowed to drive home.   Name and phone number of your driver:   TRANSPORTATION Special instructions:  WEAR A SHORT SLEEVE BUTTON FRONT SHIRT.   Please read over the following fact sheets that you were given. Anesthesia Post-op Instructions

## 2021-03-03 ENCOUNTER — Ambulatory Visit: Payer: Medicare (Managed Care) | Admitting: Anesthesiology

## 2021-03-03 ENCOUNTER — Other Ambulatory Visit: Payer: Self-pay

## 2021-03-03 ENCOUNTER — Encounter
Admission: RE | Disposition: A | Discharge status: Home / Self Care | Payer: Self-pay | Source: Home / Self Care | Attending: Ophthalmology

## 2021-03-03 ENCOUNTER — Ambulatory Visit
Admission: RE | Admit: 2021-03-03 | Discharge: 2021-03-03 | Disposition: A | Discharge status: Home / Self Care | Payer: Medicare (Managed Care) | Attending: Ophthalmology | Admitting: Ophthalmology

## 2021-03-03 DIAGNOSIS — E1136 Type 2 diabetes mellitus with diabetic cataract: Secondary | ICD-10-CM | POA: Diagnosis present

## 2021-03-03 DIAGNOSIS — E785 Hyperlipidemia, unspecified: Secondary | ICD-10-CM

## 2021-03-03 DIAGNOSIS — Z79899 Other long term (current) drug therapy: Secondary | ICD-10-CM | POA: Insufficient documentation

## 2021-03-03 DIAGNOSIS — Z955 Presence of coronary angioplasty implant and graft: Secondary | ICD-10-CM | POA: Diagnosis not present

## 2021-03-03 DIAGNOSIS — Z7982 Long term (current) use of aspirin: Secondary | ICD-10-CM | POA: Insufficient documentation

## 2021-03-03 DIAGNOSIS — Z87891 Personal history of nicotine dependence: Secondary | ICD-10-CM | POA: Diagnosis not present

## 2021-03-03 DIAGNOSIS — H2512 Age-related nuclear cataract, left eye: Secondary | ICD-10-CM | POA: Insufficient documentation

## 2021-03-03 DIAGNOSIS — I69354 Hemiplegia and hemiparesis following cerebral infarction affecting left non-dominant side: Secondary | ICD-10-CM | POA: Diagnosis not present

## 2021-03-03 DIAGNOSIS — Z993 Dependence on wheelchair: Secondary | ICD-10-CM | POA: Insufficient documentation

## 2021-03-03 HISTORY — DX: Dependence on wheelchair: Z99.3

## 2021-03-03 HISTORY — PX: CATARACT EXTRACTION W/PHACO: SHX586

## 2021-03-03 LAB — GLUCOSE, CAPILLARY
Glucose-Capillary: 148 mg/dL — ABNORMAL HIGH (ref 70–99)
Glucose-Capillary: 172 mg/dL — ABNORMAL HIGH (ref 70–99)

## 2021-03-03 SURGERY — PHACOEMULSIFICATION, CATARACT, WITH IOL INSERTION
Anesthesia: Monitor Anesthesia Care | Site: Eye | Laterality: Left

## 2021-03-03 MED ORDER — FENTANYL CITRATE (PF) 100 MCG/2ML IJ SOLN
INTRAMUSCULAR | Status: DC | PRN
  Administered 2021-03-03: 50 ug via INTRAVENOUS

## 2021-03-03 MED ORDER — LIDOCAINE HCL (PF) 2 % IJ SOLN
INTRAOCULAR | Status: DC | PRN
  Administered 2021-03-03: 2 mL

## 2021-03-03 MED ORDER — ONDANSETRON HCL 4 MG/2ML IJ SOLN: 4.0000 mg | Freq: Once | INTRAMUSCULAR | Status: DC | PRN

## 2021-03-03 MED ORDER — TETRACAINE HCL 0.5 % OP SOLN
1.0000 [drp] | OPHTHALMIC | Status: DC | PRN
  Administered 2021-03-03 (×3): 1 [drp] via OPHTHALMIC

## 2021-03-03 MED ORDER — NA HYALUR & NA CHOND-NA HYALUR 0.4-0.35 ML IO KIT
PACK | INTRAOCULAR | Status: DC | PRN
  Administered 2021-03-03: 1 mL via INTRAOCULAR

## 2021-03-03 MED ORDER — MIDAZOLAM HCL 2 MG/2ML IJ SOLN
INTRAMUSCULAR | Status: DC | PRN
  Administered 2021-03-03: 1 mg via INTRAVENOUS

## 2021-03-03 MED ORDER — CYCLOPENTOLATE HCL 2 % OP SOLN
1.0000 [drp] | OPHTHALMIC | Status: AC
  Administered 2021-03-03 (×3): 1 [drp] via OPHTHALMIC

## 2021-03-03 MED ORDER — ATORVASTATIN CALCIUM 80 MG PO TABS: 80.0000 mg | ORAL_TABLET | Freq: Every day | ORAL | 1 refills | Status: AC

## 2021-03-03 MED ORDER — EPINEPHRINE PF 1 MG/ML IJ SOLN
INTRAOCULAR | Status: DC | PRN
  Administered 2021-03-03: 78 mL via OPHTHALMIC

## 2021-03-03 MED ORDER — BRIMONIDINE TARTRATE-TIMOLOL 0.2-0.5 % OP SOLN
OPHTHALMIC | Status: DC | PRN
  Administered 2021-03-03: 1 [drp] via OPHTHALMIC

## 2021-03-03 MED ORDER — CEFUROXIME OPHTHALMIC INJECTION 1 MG/0.1 ML
INJECTION | OPHTHALMIC | Status: DC | PRN
  Administered 2021-03-03: 0.1 mL via INTRACAMERAL

## 2021-03-03 MED ORDER — PHENYLEPHRINE HCL 10 % OP SOLN
1.0000 [drp] | OPHTHALMIC | Status: AC
  Administered 2021-03-03 (×3): 1 [drp] via OPHTHALMIC

## 2021-03-03 MED ORDER — ACETAMINOPHEN 500 MG PO TABS: 1000.0000 mg | ORAL_TABLET | Freq: Once | ORAL | Status: DC | PRN

## 2021-03-03 MED ORDER — LACTATED RINGERS IV SOLN: INTRAVENOUS | Status: DC

## 2021-03-03 MED ORDER — ACETAMINOPHEN 160 MG/5ML PO SOLN: 975.0000 mg | Freq: Once | ORAL | Status: DC | PRN

## 2021-03-03 SURGICAL SUPPLY — 15 items
CANNULA ANT/CHMB 27GA (MISCELLANEOUS) ×3 IMPLANT
GLOVE SURG ENC TEXT LTX SZ7.5 (GLOVE) ×3 IMPLANT
GLOVE SURG TRIUMPH 8.0 PF LTX (GLOVE) ×3 IMPLANT
GOWN STRL REUS W/ TWL LRG LVL3 (GOWN DISPOSABLE) ×2 IMPLANT
GOWN STRL REUS W/TWL LRG LVL3 (GOWN DISPOSABLE) ×6
LENS IOL TECNIS EYHANCE 22.0 (Intraocular Lens) ×3 IMPLANT
MARKER SKIN DUAL TIP RULER LAB (MISCELLANEOUS) ×3 IMPLANT
NEEDLE CAPSULORHEX 25GA (NEEDLE) ×3 IMPLANT
NEEDLE FILTER BLUNT 18X 1/2SAF (NEEDLE) ×4
NEEDLE FILTER BLUNT 18X1 1/2 (NEEDLE) ×2 IMPLANT
PACK EYE AFTER SURG (MISCELLANEOUS) ×3 IMPLANT
SYR 3ML LL SCALE MARK (SYRINGE) ×6 IMPLANT
SYR TB 1ML LUER SLIP (SYRINGE) ×3 IMPLANT
WATER STERILE IRR 250ML POUR (IV SOLUTION) ×3 IMPLANT
WIPE NON LINTING 3.25X3.25 (MISCELLANEOUS) ×3 IMPLANT

## 2021-03-03 NOTE — Anesthesia Postprocedure Evaluation (Signed)
Anesthesia Post Note  Patient: Juan King  Procedure(s) Performed: CATARACT EXTRACTION PHACO AND INTRAOCULAR LENS PLACEMENT (IOC) LEFT DIABETIC 2.59 00:48.1 (Left: Eye)     Patient location during evaluation: PACU Anesthesia Type: MAC Level of consciousness: awake and alert Pain management: pain level controlled Vital Signs Assessment: post-procedure vital signs reviewed and stable Respiratory status: spontaneous breathing, nonlabored ventilation, respiratory function stable and patient connected to nasal cannula oxygen Cardiovascular status: stable and blood pressure returned to baseline Postop Assessment: no apparent nausea or vomiting Anesthetic complications: no   No notable events documented.  April Manson

## 2021-03-03 NOTE — H&P (Signed)
[facility]   Primary Care Physician:  Delton Prairie, FNP Ophthalmologist: Dr. Lockie Mola  Pre-Procedure History & Physical: HPI:  Juan King is a 70 y.o. male here for ophthalmic surgery.   Past Medical History:  Diagnosis Date   Allergic rhinitis    Coronary arteriosclerosis    Diabetes mellitus type 2, controlled (HCC)    Diabetes mellitus without complication (HCC)    Hypercholesteremia    Hyperlipidemia    Hypertension    Neuropathy of both feet    Personal history of fall    Psoriasis    Seasonal allergies    Stroke [facility])    Left sided weakness   Wheelchair dependent    (02/19/21 reports able to transfer with assist x1)    Past Surgical History:  Procedure Laterality Date   CORONARY STENT PLACEMENT      Prior to Admission medications   Medication Sig Start Date End Date Taking? Authorizing Provider  acetaminophen (TYLENOL) 500 MG tablet Take 500 mg by mouth every 6 (six) hours as needed for mild pain.   Yes [provider]  albuterol (PROVENTIL HFA;VENTOLIN HFA) 108 (90 Base) MCG/ACT inhaler Inhale 2 puffs into the lungs every 6 (six) hours as needed for wheezing or shortness of breath. Patient taking differently: Inhale 2 puffs into the lungs every 4 (four) hours as needed for wheezing or shortness of breath. 05/29/16  Yes Janeann Forehand., MD  amLODipine (NORVASC) 2.5 MG tablet Take 7.5 mg by mouth daily with supper.   Yes [provider]  aspirin EC 81 MG tablet Take 81 mg by mouth daily with supper.   Yes [provider]  atorvastatin (LIPITOR) 80 MG tablet Take 1 tablet (80 mg total) by mouth daily. Patient taking differently: Take 80 mg by mouth daily. With supper 04/16/16  Yes Janeann Forehand., MD  carvedilol (COREG CR) 40 MG 24 hr capsule Take 40 mg by mouth daily with supper.   Yes [provider]  cholecalciferol (VITAMIN D3) 25 MCG (1000 UNIT) tablet Take 1,000 Units by mouth daily with  supper.   Yes [provider]  cloNIDine (CATAPRES - DOSED IN MG/24 HR) 0.2 mg/24hr patch Place 1 patch (0.2 mg total) onto the skin once a week. Wear ONLY 1 PATCH at a time 02/12/20  Yes Sreenath, Sudheer B, MD  doxazosin (CARDURA) 2 MG tablet Take 2 mg by mouth every evening.   Yes [provider]  empagliflozin (JARDIANCE) 10 MG TABS tablet Take 10 mg by mouth daily.   Yes [provider]  folic acid (FOLVITE) 1 MG tablet Take 1 mg by mouth daily with supper.   Yes [provider]  gabapentin (NEURONTIN) 100 MG capsule Take 1 capsule 3 times a day. Patient taking differently: Take 300 mg by mouth every evening. Take 1 capsule 3 times a day. 04/16/16  Yes Janeann Forehand., MD  isosorbide mononitrate (IMDUR) 30 MG 24 hr tablet Take 1 tablet (30 mg total) by mouth daily. 08/13/16  Yes Janeann Forehand., MD  latanoprost (XALATAN) 0.005 % ophthalmic solution Place 1 drop into both eyes at bedtime.   Yes [provider]  liver oil-zinc oxide (DESITIN) 40 % ointment Apply 1 application topically 2 (two) times daily.   Yes [provider]  loperamide (IMODIUM) 2 MG capsule Take 2 mg by mouth as needed for diarrhea or loose stools. (max 4 capsules in 24 hours)   Yes  [provider]  methotrexate (RHEUMATREX) 2.5 MG tablet Take 15 mg by mouth once a week. Caution:Chemotherapy. Protect from light. (Tuesday)   Yes [provider]  mirtazapine (REMERON) 15 MG tablet Take 15 mg by mouth at bedtime.   Yes [provider]  psyllium (METAMUCIL SMOOTH TEXTURE) 28 % packet Take 1 packet by mouth 2 (two) times daily.   Yes [provider]  sertraline (ZOLOFT) 50 MG tablet Take 50 mg by mouth daily.   Yes [provider]  Skin Protectants, Misc. (EUCERIN) cream Apply 1 application topically See admin instructions. Apply 1 application into psoriasis spots daily after bathing   Yes [provider]  tiZANidine  (ZANAFLEX) 2 MG tablet Take 2 mg by mouth 2 (two) times daily.   Yes [provider]  triamcinolone cream (KENALOG) 0.1 % Apply 1 application topically 2 (two) times daily. Apply twice a day until clear.   Yes [provider]  hydrocortisone acetate 0.5 % cream Apply 1 application topically 2 (two) times daily.    [provider]    Allergies as of 12/31/2020   (No Known Allergies)    Family History  Problem Relation Age of Onset   Diabetes Mother    Hypertension Mother    Diabetes Father    Vision loss Father    Hypertension Father    Diabetes Sister     Social History   Socioeconomic History   Marital status: Single    Spouse name: Not on file   Number of children: Not on file   Years of education: Not on file   Highest education level: Not on file  Occupational History   Not on file  Tobacco Use   Smoking status: Former    Pack years: 0.00    Types: Cigarettes   Smokeless tobacco: Never   Tobacco comments:    Quit smoking prior to 2017  Vaping Use   Vaping Use: Never used  Substance and Sexual Activity   Alcohol use: No    Alcohol/week: 0.0 standard drinks   Drug use: No   Sexual activity: Not on file  Other Topics Concern   Not on file  Social History Narrative   ** Merged History Encounter **       Social Determinants of Health   Financial Resource Strain: Not on file  Food Insecurity: Not on file  Transportation Needs: Not on file  Physical Activity: Not on file  Stress: Not on file  Social Connections: Not on file  Intimate Partner Violence: Not on file    Review of Systems: See HPI, otherwise negative ROS  Physical Exam: BP (!) 157/80   Pulse 69   Temp 97.7 F (36.5 C)   Resp 18   Ht 5\' 4"  (1.626 m)   Wt 56.2 kg   SpO2 97%   BMI 21.28 kg/m  General:   Alert,  pleasant and cooperative in NAD Head:  Normocephalic and atraumatic. Lungs:  Clear to auscultation.    Heart:  Regular rate and rhythm.    Impression/Plan: Juan King is here for ophthalmic surgery.  Risks, benefits, limitations, and alternatives regarding ophthalmic surgery have been reviewed with the patient.  Questions have been answered.  All parties agreeable.   [name], MD  03/03/2021, 7:35 AM

## 2021-03-03 NOTE — Transfer of Care (Signed)
Immediate Anesthesia Transfer of Care Note  Patient: Juan King  Procedure(s) Performed: CATARACT EXTRACTION PHACO AND INTRAOCULAR LENS PLACEMENT (IOC) LEFT DIABETIC 2.59 00:48.1 (Left: Eye)  Patient Location: PACU  Anesthesia Type: MAC  Level of Consciousness: awake, alert  and patient cooperative  Airway and Oxygen Therapy: Patient Spontanous Breathing and Patient connected to supplemental oxygen  Post-op Assessment: Post-op Vital signs reviewed, Patient's Cardiovascular Status Stable, Respiratory Function Stable, Patent Airway and No signs of Nausea or vomiting  Post-op Vital Signs: Reviewed and stable  Complications: No notable events documented.

## 2021-03-03 NOTE — Op Note (Signed)
  OPERATIVE NOTE  Juan King [id number] 03/03/2021   PREOPERATIVE DIAGNOSIS:  Nuclear sclerotic cataract left eye. H25.12   POSTOPERATIVE DIAGNOSIS:    Nuclear sclerotic cataract left eye.     PROCEDURE:  Phacoemusification with posterior chamber intraocular lens placement of the left eye  Ultrasound time: Procedure(s) with comments: CATARACT EXTRACTION PHACO AND INTRAOCULAR LENS PLACEMENT (IOC) LEFT DIABETIC 2.59 00:48.1 (Left) - Diabetic - oral meds  LENS:   Implant Name Type Inv. Item Serial No. Manufacturer Lot No. LRB No. Used Action  LENS IOL TECNIS EYHANCE 22.0 - [id number] Intraocular Lens LENS IOL TECNIS EYHANCE 22.0 [id number] JOHNSON   Left 1 Implanted      SURGEON:  Deirdre Evener, MD   ANESTHESIA:  Topical with tetracaine drops and 2% Xylocaine jelly, augmented with 1% preservative-free intracameral lidocaine.    COMPLICATIONS:  None.   DESCRIPTION OF PROCEDURE:  The patient was identified in the holding room and transported to the operating room and placed in the supine position under the operating microscope.  The left eye was identified as the operative eye and it was prepped and draped in the usual sterile ophthalmic fashion.   A 1 millimeter clear-corneal paracentesis was made at the 1:30 position.  0.5 ml of preservative-free 1% lidocaine was injected into the anterior chamber.  The anterior chamber was filled with Viscoat viscoelastic.  A 2.4 millimeter keratome was used to make a near-clear corneal incision at the 10:30 position.  .  A curvilinear capsulorrhexis was made with a cystotome and capsulorrhexis forceps.  Balanced salt solution was used to hydrodissect and hydrodelineate the nucleus.   Phacoemulsification was then used in stop and chop fashion to remove the lens nucleus and epinucleus.  The remaining cortex was then removed using the irrigation and aspiration handpiece. Provisc was then placed into the capsular bag to distend it for lens  placement.  A lens was then injected into the capsular bag.  The remaining viscoelastic was aspirated.   Wounds were hydrated with balanced salt solution.  The anterior chamber was inflated to a physiologic pressure with balanced salt solution.  No wound leaks were noted. Cefuroxime 0.1 ml of a 10mg /ml solution was injected into the anterior chamber for a dose of 1 mg of intracameral antibiotic at the completion of the case.   Timolol and Brimonidine drops were applied to the eye.  The patient was taken to the recovery room in stable condition without complications of anesthesia or surgery.  Juan King 03/03/2021, 8:42 AM

## 2021-03-03 NOTE — Anesthesia Preprocedure Evaluation (Signed)
Anesthesia Evaluation  Patient identified by MRN, date of birth, ID band Patient awake    Reviewed: Allergy & Precautions, H&P , NPO status , Patient's Chart, lab work & pertinent test results, reviewed documented beta blocker date and time   Airway Mallampati: III  TM Distance: >3 FB Neck ROM: full    Dental no notable dental hx.    Pulmonary asthma , former smoker,    Pulmonary exam normal breath sounds clear to auscultation       Cardiovascular hypertension,  Rhythm:regular Rate:Normal     Neuro/Psych dementia CVA (LUE, LLE weakness) negative psych ROS   GI/Hepatic negative GI ROS, Neg liver ROS,   Endo/Other  diabetes  Renal/GU negative Renal ROS  negative genitourinary   Musculoskeletal   Abdominal   Peds  Hematology negative hematology ROS (+)   Anesthesia Other Findings   Reproductive/Obstetrics negative OB ROS                             Anesthesia Physical Anesthesia Plan  ASA: 3  Anesthesia Plan: MAC   Post-op Pain Management:    Induction:   PONV Risk Score and Plan: 1 and Treatment may vary due to age or medical condition  Airway Management Planned:   Additional Equipment:   Intra-op Plan:   Post-operative Plan:   Informed Consent: I have reviewed the patients History and Physical, chart, labs and discussed the procedure including the risks, benefits and alternatives for the proposed anesthesia with the patient or authorized representative who has indicated his/her understanding and acceptance.     Dental Advisory Given  Plan Discussed with: CRNA and Anesthesiologist  Anesthesia Plan Comments:         Anesthesia Quick Evaluation

## 2021-03-04 ENCOUNTER — Encounter: Payer: Self-pay | Admitting: Ophthalmology

## 2021-03-04 ENCOUNTER — Other Ambulatory Visit: Payer: Self-pay

## 2021-03-05 ENCOUNTER — Encounter: Payer: Self-pay | Admitting: Ophthalmology

## 2021-03-18 NOTE — Anesthesia Preprocedure Evaluation (Addendum)
Anesthesia Evaluation  Patient identified by MRN, date of birth, ID band Patient awake    Reviewed: Allergy & Precautions, NPO status , Patient's Chart, lab work & pertinent test results  History of Anesthesia Complications Negative for: history of anesthetic complications  Airway Mallampati: IV   Neck ROM: Full    Dental  (+) Edentulous Upper, Edentulous Lower   Pulmonary asthma ,    Pulmonary exam normal breath sounds clear to auscultation       Cardiovascular hypertension, Normal cardiovascular exam Rhythm:Regular Rate:Normal     Neuro/Psych CVA (2019, residual left-sided weakness, wheelchair bound)    GI/Hepatic negative GI ROS,   Endo/Other  diabetes, Type 2  Renal/GU negative Renal ROS     Musculoskeletal   Abdominal   Peds  Hematology negative hematology ROS (+)   Anesthesia Other Findings   Reproductive/Obstetrics                            Anesthesia Physical Anesthesia Plan  ASA: 3  Anesthesia Plan: MAC   Post-op Pain Management:    Induction: Intravenous  PONV Risk Score and Plan: 1 and TIVA, Midazolam and Treatment may vary due to age or medical condition  Airway Management Planned: Nasal Cannula  Additional Equipment:   Intra-op Plan:   Post-operative Plan:   Informed Consent: I have reviewed the patients History and Physical, chart, labs and discussed the procedure including the risks, benefits and alternatives for the proposed anesthesia with the patient or authorized representative who has indicated his/her understanding and acceptance.       Plan Discussed with: CRNA  Anesthesia Plan Comments:        Anesthesia Quick Evaluation

## 2021-03-19 ENCOUNTER — Ambulatory Visit
Admission: RE | Admit: 2021-03-19 | Discharge: 2021-03-19 | Disposition: A | Discharge status: Home / Self Care | Payer: Medicare (Managed Care) | Attending: Ophthalmology | Admitting: Ophthalmology

## 2021-03-19 ENCOUNTER — Ambulatory Visit: Payer: Medicare (Managed Care) | Admitting: Anesthesiology

## 2021-03-19 ENCOUNTER — Encounter: Payer: Self-pay | Admitting: Ophthalmology

## 2021-03-19 ENCOUNTER — Other Ambulatory Visit: Payer: Self-pay

## 2021-03-19 ENCOUNTER — Encounter
Admission: RE | Disposition: A | Discharge status: Home / Self Care | Payer: Self-pay | Source: Home / Self Care | Attending: Ophthalmology

## 2021-03-19 DIAGNOSIS — Z993 Dependence on wheelchair: Secondary | ICD-10-CM | POA: Diagnosis not present

## 2021-03-19 DIAGNOSIS — Z833 Family history of diabetes mellitus: Secondary | ICD-10-CM | POA: Diagnosis not present

## 2021-03-19 DIAGNOSIS — H2511 Age-related nuclear cataract, right eye: Secondary | ICD-10-CM | POA: Diagnosis not present

## 2021-03-19 DIAGNOSIS — Z8249 Family history of ischemic heart disease and other diseases of the circulatory system: Secondary | ICD-10-CM | POA: Insufficient documentation

## 2021-03-19 DIAGNOSIS — Z79899 Other long term (current) drug therapy: Secondary | ICD-10-CM | POA: Diagnosis not present

## 2021-03-19 DIAGNOSIS — Z87891 Personal history of nicotine dependence: Secondary | ICD-10-CM | POA: Insufficient documentation

## 2021-03-19 DIAGNOSIS — E1136 Type 2 diabetes mellitus with diabetic cataract: Secondary | ICD-10-CM | POA: Insufficient documentation

## 2021-03-19 DIAGNOSIS — Z7982 Long term (current) use of aspirin: Secondary | ICD-10-CM | POA: Insufficient documentation

## 2021-03-19 DIAGNOSIS — Z955 Presence of coronary angioplasty implant and graft: Secondary | ICD-10-CM | POA: Diagnosis not present

## 2021-03-19 DIAGNOSIS — I69354 Hemiplegia and hemiparesis following cerebral infarction affecting left non-dominant side: Secondary | ICD-10-CM | POA: Diagnosis not present

## 2021-03-19 DIAGNOSIS — E1140 Type 2 diabetes mellitus with diabetic neuropathy, unspecified: Secondary | ICD-10-CM | POA: Insufficient documentation

## 2021-03-19 HISTORY — PX: CATARACT EXTRACTION W/PHACO: SHX586

## 2021-03-19 LAB — GLUCOSE, CAPILLARY
Glucose-Capillary: 106 mg/dL — ABNORMAL HIGH (ref 70–99)
Glucose-Capillary: 99 mg/dL (ref 70–99)

## 2021-03-19 SURGERY — PHACOEMULSIFICATION, CATARACT, WITH IOL INSERTION
Anesthesia: Monitor Anesthesia Care | Site: Eye | Laterality: Right

## 2021-03-19 MED ORDER — ACETAMINOPHEN 160 MG/5ML PO SOLN: 325.0000 mg | ORAL | Status: DC | PRN

## 2021-03-19 MED ORDER — BRIMONIDINE TARTRATE-TIMOLOL 0.2-0.5 % OP SOLN
OPHTHALMIC | Status: DC | PRN
  Administered 2021-03-19: 1 [drp] via OPHTHALMIC

## 2021-03-19 MED ORDER — SIGHTPATH DOSE#1 NA HYALUR & NA CHOND-NA HYALUR IO KIT
PACK | INTRAOCULAR | Status: DC | PRN
  Administered 2021-03-19: 1 via OPHTHALMIC

## 2021-03-19 MED ORDER — LACTATED RINGERS IV SOLN: INTRAVENOUS | Status: DC

## 2021-03-19 MED ORDER — FENTANYL CITRATE (PF) 100 MCG/2ML IJ SOLN
INTRAMUSCULAR | Status: DC | PRN
  Administered 2021-03-19: 50 ug via INTRAVENOUS

## 2021-03-19 MED ORDER — SIGHTPATH DOSE#1 BSS IO SOLN
INTRAOCULAR | Status: DC | PRN
  Administered 2021-03-19: 82 mL via OPHTHALMIC

## 2021-03-19 MED ORDER — SIGHTPATH DOSE#1 BSS IO SOLN
INTRAOCULAR | Status: DC | PRN
  Administered 2021-03-19: 15 mL via INTRAOCULAR

## 2021-03-19 MED ORDER — PHENYLEPHRINE HCL 10 % OP SOLN
1.0000 [drp] | OPHTHALMIC | Status: DC | PRN
  Administered 2021-03-19 (×3): 1 [drp] via OPHTHALMIC

## 2021-03-19 MED ORDER — MIDAZOLAM HCL 2 MG/2ML IJ SOLN
INTRAMUSCULAR | Status: DC | PRN
  Administered 2021-03-19: 1 mg via INTRAVENOUS

## 2021-03-19 MED ORDER — LIDOCAINE HCL (PF) 2 % IJ SOLN
INTRAOCULAR | Status: DC | PRN
  Administered 2021-03-19: 2 mL

## 2021-03-19 MED ORDER — CEFUROXIME OPHTHALMIC INJECTION 1 MG/0.1 ML
INJECTION | OPHTHALMIC | Status: DC | PRN
  Administered 2021-03-19: 0.1 mL via INTRACAMERAL

## 2021-03-19 MED ORDER — ACETAMINOPHEN 325 MG PO TABS: 650.0000 mg | ORAL_TABLET | Freq: Once | ORAL | Status: DC | PRN

## 2021-03-19 MED ORDER — TETRACAINE HCL 0.5 % OP SOLN
1.0000 [drp] | OPHTHALMIC | Status: DC | PRN
  Administered 2021-03-19 (×3): 1 [drp] via OPHTHALMIC

## 2021-03-19 MED ORDER — CYCLOPENTOLATE HCL 2 % OP SOLN
1.0000 [drp] | OPHTHALMIC | Status: DC | PRN
  Administered 2021-03-19 (×3): 1 [drp] via OPHTHALMIC

## 2021-03-19 MED ORDER — ONDANSETRON HCL 4 MG/2ML IJ SOLN: 4.0000 mg | Freq: Once | INTRAMUSCULAR | Status: DC | PRN

## 2021-03-19 SURGICAL SUPPLY — 15 items
CANNULA ANT/CHMB 27GA (MISCELLANEOUS) ×2 IMPLANT
GLOVE SURG ENC TEXT LTX SZ7.5 (GLOVE) ×2 IMPLANT
GLOVE SURG TRIUMPH 8.0 PF LTX (GLOVE) ×2 IMPLANT
GOWN STRL REUS W/ TWL LRG LVL3 (GOWN DISPOSABLE) ×2 IMPLANT
GOWN STRL REUS W/TWL LRG LVL3 (GOWN DISPOSABLE) ×2
LENS IOL TECNIS EYHANCE 22.5 (Intraocular Lens) ×2 IMPLANT
MARKER SKIN DUAL TIP RULER LAB (MISCELLANEOUS) ×2 IMPLANT
NEEDLE CAPSULORHEX 25GA (NEEDLE) ×2 IMPLANT
NEEDLE FILTER BLUNT 18X 1/2SAF (NEEDLE) ×2
NEEDLE FILTER BLUNT 18X1 1/2 (NEEDLE) ×2 IMPLANT
PACK EYE AFTER SURG (MISCELLANEOUS) ×2 IMPLANT
SYR 3ML LL SCALE MARK (SYRINGE) ×4 IMPLANT
SYR TB 1ML LUER SLIP (SYRINGE) ×2 IMPLANT
WATER STERILE IRR 250ML POUR (IV SOLUTION) ×2 IMPLANT
WIPE NON LINTING 3.25X3.25 (MISCELLANEOUS) ×2 IMPLANT

## 2021-03-19 NOTE — Anesthesia Procedure Notes (Signed)
Procedure Name: MAC Date/Time: 03/19/2021 11:00 AM Performed by: Jeannene Patella, CRNA Pre-anesthesia Checklist: Patient identified, Emergency Drugs available, Suction available, Timeout performed and Patient being monitored Patient Re-evaluated:Patient Re-evaluated prior to induction Oxygen Delivery Method: Nasal cannula Placement Confirmation: positive ETCO2

## 2021-03-19 NOTE — H&P (Signed)
[facility]   Primary Care Physician:  Delton Prairie, FNP Ophthalmologist: Dr. Lockie Mola  Pre-Procedure History & Physical: HPI:  Juan King is a 70 y.o. male here for ophthalmic surgery.   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 Authorizing Provider  acetaminophen (TYLENOL) 500 MG tablet Take 500 mg by mouth every 6 (six) hours as needed for mild pain.   Yes [provider]  aspirin EC 81 MG tablet Take 81 mg by mouth daily with supper.   Yes [provider]  atorvastatin (LIPITOR) 80 MG tablet Take 1 tablet (80 mg total) by mouth daily. With supper 03/03/21  Yes Blaise Palladino, Radene Knee, MD  carvedilol (COREG CR) 40 MG 24 hr capsule Take 40 mg by mouth daily with supper.   Yes [provider]  cholecalciferol (VITAMIN D3) 25 MCG (1000 UNIT) tablet Take 1,000 Units by mouth daily with supper.   Yes [provider]  cloNIDine (CATAPRES - DOSED IN MG/24 HR) 0.2 mg/24hr patch Place 1 patch (0.2 mg total) onto the skin once a week. Wear ONLY 1 PATCH at a time 02/12/20  Yes Sreenath, Sudheer  B, MD  doxazosin (CARDURA) 2 MG tablet Take 2 mg by mouth every evening.   Yes [provider]  empagliflozin (JARDIANCE) 10 MG TABS tablet Take 10 mg by mouth daily.   Yes [provider]  folic acid (FOLVITE) 1 MG tablet Take 1 mg by mouth daily with supper.   Yes [provider]  gabapentin (NEURONTIN) 100 MG capsule Take 1 capsule 3 times a day. Patient taking differently: Take 300 mg by mouth every evening. Take 1 capsule 3 times a day. 04/16/16  Yes Janeann Forehand., MD  hydrocortisone acetate 0.5 % cream Apply 1 application topically 2 (two) times daily.   Yes [provider]  isosorbide mononitrate (IMDUR) 30 MG 24 hr tablet Take 1 tablet (30 mg total) by mouth daily. 08/13/16  Yes Janeann Forehand., MD  latanoprost (XALATAN) 0.005 % ophthalmic solution Place 1 drop into both eyes at bedtime.   Yes [provider]  liver oil-zinc oxide (DESITIN) 40 % ointment Apply 1 application topically 2 (two) times daily.   Yes [provider]  loperamide (IMODIUM) 2 MG capsule Take 2 mg by mouth as needed for diarrhea or loose stools. (max 4 capsules in 24 hours)   Yes [provider]  methotrexate (RHEUMATREX) 2.5 MG tablet Take 15 mg by mouth once a week. Caution:Chemotherapy. Protect from light. (Tuesday)   Yes  [provider]  mirtazapine (REMERON) 15 MG tablet Take 15 mg by mouth at bedtime.   Yes [provider]  psyllium (METAMUCIL SMOOTH TEXTURE) 28 % packet Take 1 packet by mouth 2 (two) times daily.   Yes [provider]  sertraline (ZOLOFT) 50 MG tablet Take 50 mg by mouth daily.   Yes [provider]  Skin Protectants, Misc. (EUCERIN) cream Apply 1 application topically See admin instructions. Apply 1 application into psoriasis spots daily after bathing   Yes [provider]  tiZANidine (ZANAFLEX) 2 MG tablet Take 2 mg by mouth 2 (two) times daily.   Yes [provider]   triamcinolone cream (KENALOG) 0.1 % Apply 1 application topically 2 (two) times daily. Apply twice a day until clear.   Yes [provider]  albuterol (PROVENTIL HFA;VENTOLIN HFA) 108 (90 Base) MCG/ACT inhaler Inhale 2 puffs into the lungs every 6 (six) hours as needed for wheezing or shortness of breath. Patient taking differently: Inhale 2 puffs into the lungs every 4 (four) hours as needed for wheezing or shortness of breath. 05/29/16   Janeann Forehand., MD  amLODipine (NORVASC) 2.5 MG tablet Take 7.5 mg by mouth daily with supper.    [provider]    Allergies as of 12/31/2020   (No Known Allergies)    Family History  Problem Relation Age of Onset   Diabetes Mother    Hypertension Mother    Diabetes Father    Vision loss Father    Hypertension Father    Diabetes Sister     Social History   Socioeconomic History   Marital status: Single    Spouse name: Not on file   Number of children: Not on file   Years of education: Not on file   Highest education level: Not on file  Occupational History   Not on file  Tobacco Use   Smoking status: Former    Pack years: 0.00    Types: Cigarettes   Smokeless tobacco: Never   Tobacco comments:    Quit smoking prior to 2017  Vaping Use   Vaping Use: Never used  Substance and Sexual Activity   Alcohol use: No    Alcohol/week: 0.0 standard drinks   Drug use: No   Sexual activity: Not on file  Other Topics Concern   Not on file  Social History Narrative   ** Merged History Encounter **       Social Determinants of Health   Financial Resource Strain: Not on file  Food Insecurity: Not on file  Transportation Needs: Not on file  Physical Activity: Not on file  Stress: Not on file  Social Connections: Not on file  Intimate Partner Violence: Not on file    Review of Systems: See HPI, otherwise negative ROS  Physical Exam: BP (!) 149/72   Pulse 72   Temp 97.9 F (36.6 C) (Temporal)   Resp 18    Ht 5\' 4"  (1.626 m)   Wt 56.2 kg   SpO2 99%   BMI 21.28 kg/m  General:   Alert,  pleasant and cooperative in NAD Head:  Normocephalic and atraumatic. Lungs:  Clear to auscultation.    Heart:  Regular rate and rhythm.   Impression/Plan: Juan King is here for ophthalmic surgery.  Risks, benefits, limitations, and alternatives regarding ophthalmic surgery have been reviewed with the patient.  Questions have been answered.  All parties agreeable.   [name], MD  03/19/2021, 9:59 AM

## 2021-03-19 NOTE — Transfer of Care (Signed)
Immediate Anesthesia Transfer of Care Note  Patient: Juan King  Procedure(s) Performed: CATARACT EXTRACTION PHACO AND INTRAOCULAR LENS PLACEMENT (IOC) RIGHT DIABETIC 3.83 00:58.7 (Right: Eye)  Patient Location: PACU  Anesthesia Type: MAC  Level of Consciousness: awake, alert  and patient cooperative  Airway and Oxygen Therapy: Patient Spontanous Breathing and Patient connected to supplemental oxygen  Post-op Assessment: Post-op Vital signs reviewed, Patient's Cardiovascular Status Stable, Respiratory Function Stable, Patent Airway and No signs of Nausea or vomiting  Post-op Vital Signs: Reviewed and stable  Complications: No notable events documented.

## 2021-03-19 NOTE — Op Note (Signed)
LOCATION:  [facility]   PREOPERATIVE DIAGNOSIS:    Nuclear sclerotic cataract right eye. H25.11   POSTOPERATIVE DIAGNOSIS:  Nuclear sclerotic cataract right eye.     PROCEDURE:  Phacoemusification with posterior chamber intraocular lens placement of the right eye   ULTRASOUND TIME: Procedure(s) with comments: CATARACT EXTRACTION PHACO AND INTRAOCULAR LENS PLACEMENT (IOC) RIGHT DIABETIC 3.83 00:58.7 (Right) - Diabetic - oral meds  LENS:   Implant Name Type Inv. Item Serial No. Manufacturer Lot No. LRB No. Used Action  LENS IOL TECNIS EYHANCE 22.5 - [id number] Intraocular Lens LENS IOL TECNIS EYHANCE 22.5 [id number] JOHNSON   Right 1 Implanted         SURGEON:  Deirdre Evener, MD   ANESTHESIA:  Topical with tetracaine drops and 2% Xylocaine jelly, augmented with 1% preservative-free intracameral lidocaine.    COMPLICATIONS:  None.   DESCRIPTION OF PROCEDURE:  The patient was identified in the holding room and transported to the operating room and placed in the supine position under the operating microscope.  The right eye was identified as the operative eye and it was prepped and draped in the usual sterile ophthalmic fashion.   A 1 millimeter clear-corneal paracentesis was made at the 12:00 position.  0.5 ml of preservative-free 1% lidocaine was injected into the anterior chamber. The anterior chamber was filled with Viscoat viscoelastic.  A 2.4 millimeter keratome was used to make a near-clear corneal incision at the 9:00 position.  A curvilinear capsulorrhexis was made with a cystotome and capsulorrhexis forceps.  Balanced salt solution was used to hydrodissect and hydrodelineate the nucleus.   Phacoemulsification was then used in stop and chop fashion to remove the lens nucleus and epinucleus.  The remaining cortex was then removed using the irrigation and aspiration handpiece. Provisc was then placed into the capsular bag to distend it for lens placement.  A lens  was then injected into the capsular bag.  The remaining viscoelastic was aspirated.   Wounds were hydrated with balanced salt solution.  The anterior chamber was inflated to a physiologic pressure with balanced salt solution.  No wound leaks were noted. Cefuroxime 0.1 ml of a 10mg /ml solution was injected into the anterior chamber for a dose of 1 mg of intracameral antibiotic at the completion of the case.   Timolol and Brimonidine drops were applied to the eye.  The patient was taken to the recovery room in stable condition without complications of anesthesia or surgery.   Tayja Manzer 03/19/2021, 11:23 AM

## 2021-03-19 NOTE — Anesthesia Postprocedure Evaluation (Signed)
Anesthesia Post Note  Patient: Juan King  Procedure(s) Performed: CATARACT EXTRACTION PHACO AND INTRAOCULAR LENS PLACEMENT (IOC) RIGHT DIABETIC 3.83 00:58.7 (Right: Eye)     Patient location during evaluation: PACU Anesthesia Type: MAC Level of consciousness: awake and alert, oriented and patient cooperative Pain management: pain level controlled Vital Signs Assessment: post-procedure vital signs reviewed and stable Respiratory status: spontaneous breathing, nonlabored ventilation and respiratory function stable Cardiovascular status: blood pressure returned to baseline and stable Postop Assessment: adequate PO intake Anesthetic complications: no   No notable events documented.  Darrin Nipper

## 2021-03-21 ENCOUNTER — Encounter: Payer: Self-pay | Admitting: Ophthalmology

## 2022-01-19 ENCOUNTER — Other Ambulatory Visit: Payer: Self-pay | Admitting: Family Medicine

## 2022-01-19 DIAGNOSIS — R1909 Other intra-abdominal and pelvic swelling, mass and lump: Secondary | ICD-10-CM

## 2022-01-30 ENCOUNTER — Ambulatory Visit
Admission: RE | Admit: 2022-01-30 | Discharge: 2022-01-30 | Disposition: A | Discharge status: Home / Self Care | Payer: Medicare (Managed Care) | Source: Ambulatory Visit | Attending: Family | Admitting: Family

## 2022-01-30 DIAGNOSIS — R1909 Other intra-abdominal and pelvic swelling, mass and lump: Secondary | ICD-10-CM | POA: Insufficient documentation

## 2022-01-30 LAB — POCT I-STAT CREATININE: Creatinine, Ser: 0.9 mg/dL (ref 0.61–1.24)

## 2022-01-30 MED ORDER — IOHEXOL 300 MG/ML  SOLN
75.0000 mL | Freq: Once | INTRAMUSCULAR | Status: AC | PRN
  Administered 2022-01-30: 75 mL via INTRAVENOUS
